# Patient Record
Sex: Male | Born: 1995
Health system: Southern US, Community
[De-identification: ages and names within clinical notes are randomized; demographics above are authoritative.]

## PROBLEM LIST (undated history)

## (undated) DIAGNOSIS — F419 Anxiety disorder, unspecified: Secondary | ICD-10-CM

## (undated) DIAGNOSIS — J45909 Unspecified asthma, uncomplicated: Secondary | ICD-10-CM

## (undated) HISTORY — DX: Unspecified asthma, uncomplicated: J45.909

## (undated) HISTORY — PX: TONSILLECTOMY: SUR1361

## (undated) HISTORY — DX: Anxiety disorder, unspecified: F41.9

## (undated) HISTORY — PX: WISDOM TOOTH EXTRACTION: SHX21

## (undated) HISTORY — PX: POLYPECTOMY: SHX149

---
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
## 2016-07-26 DIAGNOSIS — Z915 Personal history of self-harm: Secondary | ICD-10-CM | POA: Diagnosis not present

## 2016-07-26 DIAGNOSIS — R45851 Suicidal ideations: Secondary | ICD-10-CM | POA: Diagnosis not present

## 2016-07-26 DIAGNOSIS — F333 Major depressive disorder, recurrent, severe with psychotic symptoms: Secondary | ICD-10-CM | POA: Diagnosis not present

## 2016-07-26 DIAGNOSIS — F332 Major depressive disorder, recurrent severe without psychotic features: Secondary | ICD-10-CM | POA: Diagnosis not present

## 2016-07-26 DIAGNOSIS — G479 Sleep disorder, unspecified: Secondary | ICD-10-CM | POA: Diagnosis not present

## 2016-07-27 DIAGNOSIS — F332 Major depressive disorder, recurrent severe without psychotic features: Secondary | ICD-10-CM | POA: Diagnosis not present

## 2016-07-28 DIAGNOSIS — F332 Major depressive disorder, recurrent severe without psychotic features: Secondary | ICD-10-CM | POA: Diagnosis not present

## 2016-07-29 DIAGNOSIS — F332 Major depressive disorder, recurrent severe without psychotic features: Secondary | ICD-10-CM | POA: Diagnosis not present

## 2016-07-30 DIAGNOSIS — F332 Major depressive disorder, recurrent severe without psychotic features: Secondary | ICD-10-CM | POA: Diagnosis not present

## 2016-07-31 DIAGNOSIS — F332 Major depressive disorder, recurrent severe without psychotic features: Secondary | ICD-10-CM | POA: Diagnosis not present

## 2016-08-01 DIAGNOSIS — F332 Major depressive disorder, recurrent severe without psychotic features: Secondary | ICD-10-CM | POA: Diagnosis not present

## 2016-08-02 DIAGNOSIS — F332 Major depressive disorder, recurrent severe without psychotic features: Secondary | ICD-10-CM | POA: Diagnosis not present

## 2016-08-03 DIAGNOSIS — F332 Major depressive disorder, recurrent severe without psychotic features: Secondary | ICD-10-CM | POA: Diagnosis not present

## 2016-08-04 DIAGNOSIS — F332 Major depressive disorder, recurrent severe without psychotic features: Secondary | ICD-10-CM | POA: Diagnosis not present

## 2016-08-05 DIAGNOSIS — F331 Major depressive disorder, recurrent, moderate: Secondary | ICD-10-CM | POA: Diagnosis not present

## 2016-08-05 DIAGNOSIS — Z62898 Other specified problems related to upbringing: Secondary | ICD-10-CM | POA: Diagnosis not present

## 2016-08-05 DIAGNOSIS — F332 Major depressive disorder, recurrent severe without psychotic features: Secondary | ICD-10-CM | POA: Diagnosis not present

## 2016-08-05 DIAGNOSIS — Z635 Disruption of family by separation and divorce: Secondary | ICD-10-CM | POA: Diagnosis not present

## 2016-08-13 DIAGNOSIS — F332 Major depressive disorder, recurrent severe without psychotic features: Secondary | ICD-10-CM | POA: Diagnosis not present

## 2016-08-13 DIAGNOSIS — F331 Major depressive disorder, recurrent, moderate: Secondary | ICD-10-CM | POA: Diagnosis not present

## 2016-08-13 DIAGNOSIS — Z635 Disruption of family by separation and divorce: Secondary | ICD-10-CM | POA: Diagnosis not present

## 2016-08-13 DIAGNOSIS — Z62898 Other specified problems related to upbringing: Secondary | ICD-10-CM | POA: Diagnosis not present

## 2016-08-17 DIAGNOSIS — F332 Major depressive disorder, recurrent severe without psychotic features: Secondary | ICD-10-CM | POA: Diagnosis not present

## 2016-08-17 DIAGNOSIS — F331 Major depressive disorder, recurrent, moderate: Secondary | ICD-10-CM | POA: Diagnosis not present

## 2016-08-17 DIAGNOSIS — Z635 Disruption of family by separation and divorce: Secondary | ICD-10-CM | POA: Diagnosis not present

## 2016-08-17 DIAGNOSIS — Z62898 Other specified problems related to upbringing: Secondary | ICD-10-CM | POA: Diagnosis not present

## 2016-08-19 DIAGNOSIS — F331 Major depressive disorder, recurrent, moderate: Secondary | ICD-10-CM | POA: Diagnosis not present

## 2016-08-20 DIAGNOSIS — Z62898 Other specified problems related to upbringing: Secondary | ICD-10-CM | POA: Diagnosis not present

## 2016-08-20 DIAGNOSIS — F331 Major depressive disorder, recurrent, moderate: Secondary | ICD-10-CM | POA: Diagnosis not present

## 2016-08-20 DIAGNOSIS — Z635 Disruption of family by separation and divorce: Secondary | ICD-10-CM | POA: Diagnosis not present

## 2016-08-20 DIAGNOSIS — F332 Major depressive disorder, recurrent severe without psychotic features: Secondary | ICD-10-CM | POA: Diagnosis not present

## 2016-08-24 DIAGNOSIS — F331 Major depressive disorder, recurrent, moderate: Secondary | ICD-10-CM | POA: Diagnosis not present

## 2016-08-24 DIAGNOSIS — Z62898 Other specified problems related to upbringing: Secondary | ICD-10-CM | POA: Diagnosis not present

## 2016-08-24 DIAGNOSIS — F332 Major depressive disorder, recurrent severe without psychotic features: Secondary | ICD-10-CM | POA: Diagnosis not present

## 2016-08-24 DIAGNOSIS — Z635 Disruption of family by separation and divorce: Secondary | ICD-10-CM | POA: Diagnosis not present

## 2016-08-27 DIAGNOSIS — F331 Major depressive disorder, recurrent, moderate: Secondary | ICD-10-CM | POA: Diagnosis not present

## 2016-08-27 DIAGNOSIS — Z62898 Other specified problems related to upbringing: Secondary | ICD-10-CM | POA: Diagnosis not present

## 2016-08-27 DIAGNOSIS — Z635 Disruption of family by separation and divorce: Secondary | ICD-10-CM | POA: Diagnosis not present

## 2016-08-27 DIAGNOSIS — F332 Major depressive disorder, recurrent severe without psychotic features: Secondary | ICD-10-CM | POA: Diagnosis not present

## 2016-08-31 DIAGNOSIS — Z62898 Other specified problems related to upbringing: Secondary | ICD-10-CM | POA: Diagnosis not present

## 2016-08-31 DIAGNOSIS — Z635 Disruption of family by separation and divorce: Secondary | ICD-10-CM | POA: Diagnosis not present

## 2016-08-31 DIAGNOSIS — F332 Major depressive disorder, recurrent severe without psychotic features: Secondary | ICD-10-CM | POA: Diagnosis not present

## 2016-08-31 DIAGNOSIS — F331 Major depressive disorder, recurrent, moderate: Secondary | ICD-10-CM | POA: Diagnosis not present

## 2016-09-03 DIAGNOSIS — Z635 Disruption of family by separation and divorce: Secondary | ICD-10-CM | POA: Diagnosis not present

## 2016-09-03 DIAGNOSIS — Z62898 Other specified problems related to upbringing: Secondary | ICD-10-CM | POA: Diagnosis not present

## 2016-09-03 DIAGNOSIS — F331 Major depressive disorder, recurrent, moderate: Secondary | ICD-10-CM | POA: Diagnosis not present

## 2016-09-03 DIAGNOSIS — F332 Major depressive disorder, recurrent severe without psychotic features: Secondary | ICD-10-CM | POA: Diagnosis not present

## 2016-09-10 DIAGNOSIS — F332 Major depressive disorder, recurrent severe without psychotic features: Secondary | ICD-10-CM | POA: Diagnosis not present

## 2016-09-14 DIAGNOSIS — F332 Major depressive disorder, recurrent severe without psychotic features: Secondary | ICD-10-CM | POA: Diagnosis not present

## 2016-09-22 DIAGNOSIS — F332 Major depressive disorder, recurrent severe without psychotic features: Secondary | ICD-10-CM | POA: Diagnosis not present

## 2016-09-28 DIAGNOSIS — F332 Major depressive disorder, recurrent severe without psychotic features: Secondary | ICD-10-CM | POA: Diagnosis not present

## 2016-10-01 DIAGNOSIS — F332 Major depressive disorder, recurrent severe without psychotic features: Secondary | ICD-10-CM | POA: Diagnosis not present

## 2016-10-04 DIAGNOSIS — F332 Major depressive disorder, recurrent severe without psychotic features: Secondary | ICD-10-CM | POA: Diagnosis not present

## 2016-10-05 DIAGNOSIS — F332 Major depressive disorder, recurrent severe without psychotic features: Secondary | ICD-10-CM | POA: Diagnosis not present

## 2016-10-29 DIAGNOSIS — F332 Major depressive disorder, recurrent severe without psychotic features: Secondary | ICD-10-CM | POA: Diagnosis not present

## 2016-11-01 DIAGNOSIS — F332 Major depressive disorder, recurrent severe without psychotic features: Secondary | ICD-10-CM | POA: Diagnosis not present

## 2016-11-02 DIAGNOSIS — F332 Major depressive disorder, recurrent severe without psychotic features: Secondary | ICD-10-CM | POA: Diagnosis not present

## 2016-11-05 DIAGNOSIS — F332 Major depressive disorder, recurrent severe without psychotic features: Secondary | ICD-10-CM | POA: Diagnosis not present

## 2016-11-09 DIAGNOSIS — F332 Major depressive disorder, recurrent severe without psychotic features: Secondary | ICD-10-CM | POA: Diagnosis not present

## 2016-11-30 DIAGNOSIS — R05 Cough: Secondary | ICD-10-CM | POA: Diagnosis not present

## 2016-11-30 DIAGNOSIS — J45909 Unspecified asthma, uncomplicated: Secondary | ICD-10-CM | POA: Diagnosis not present

## 2016-12-01 ENCOUNTER — Other Ambulatory Visit: Payer: Self-pay | Admitting: Family Medicine

## 2016-12-01 ENCOUNTER — Encounter: Payer: Self-pay | Admitting: Family Medicine

## 2016-12-01 ENCOUNTER — Ambulatory Visit (INDEPENDENT_AMBULATORY_CARE_PROVIDER_SITE_OTHER): Payer: BLUE CROSS/BLUE SHIELD | Admitting: Family Medicine

## 2016-12-01 DIAGNOSIS — F3341 Major depressive disorder, recurrent, in partial remission: Secondary | ICD-10-CM

## 2016-12-01 DIAGNOSIS — L2381 Allergic contact dermatitis due to animal (cat) (dog) dander: Secondary | ICD-10-CM | POA: Diagnosis not present

## 2016-12-01 DIAGNOSIS — F324 Major depressive disorder, single episode, in partial remission: Secondary | ICD-10-CM | POA: Insufficient documentation

## 2016-12-01 DIAGNOSIS — F419 Anxiety disorder, unspecified: Secondary | ICD-10-CM | POA: Insufficient documentation

## 2016-12-01 DIAGNOSIS — J3089 Other allergic rhinitis: Secondary | ICD-10-CM

## 2016-12-01 DIAGNOSIS — J3081 Allergic rhinitis due to animal (cat) (dog) hair and dander: Secondary | ICD-10-CM | POA: Diagnosis not present

## 2016-12-01 DIAGNOSIS — J4520 Mild intermittent asthma, uncomplicated: Secondary | ICD-10-CM

## 2016-12-01 DIAGNOSIS — Z114 Encounter for screening for human immunodeficiency virus [HIV]: Secondary | ICD-10-CM

## 2016-12-01 DIAGNOSIS — Z0000 Encounter for general adult medical examination without abnormal findings: Secondary | ICD-10-CM

## 2016-12-01 DIAGNOSIS — Z13220 Encounter for screening for lipoid disorders: Secondary | ICD-10-CM

## 2016-12-01 DIAGNOSIS — J45909 Unspecified asthma, uncomplicated: Secondary | ICD-10-CM | POA: Insufficient documentation

## 2016-12-01 MED ORDER — CETIRIZINE HCL 10 MG PO TABS: 10.0000 mg | ORAL_TABLET | Freq: Every day | ORAL | 11 refills | Status: AC

## 2016-12-01 NOTE — Assessment & Plan Note (Signed)
Stable without exacerbation Recent had acute asthma flare vs allergic reaction by history seen by Urgent Care, resumed albuterol, suspected trigger cat - No recent night-time symptoms, hospitalization or steroid use  Plan: 1. Counseling on Asthma maintenance, trigger avoidance 2. Continue Albuterol 2 puffs q 4-6 hour PRN wheezing/cough/SOB - secondary to flare with allergy triggers / exercise induced. No new rx today, has existing and not due yet. Will refill when ready. 3. Considered add future Singulair given asthma/allergy/exercise induced, seems would be appropriate candidate for maintenance on this, instead of starting ICS - may not need if very limited or rare exacerbations, depends on cat allergy situation at home 4. Return criteria given to follow-up vs when to go to ED

## 2016-12-01 NOTE — Assessment & Plan Note (Signed)
Secondary mostly to cat allergy, now less environmental Continue Zyrtec Consider Singulair in future also with asthma

## 2016-12-01 NOTE — Progress Notes (Signed)
Subjective:    Patient ID: Joshua Kinds., male    DOB: [birth year], 21 y.o.   MRN: [id number]  Joshua Edwards. is a 21 y.o. male presenting on 12/01/2016 for Establish Care (has Hx of asthma needs inhalers allergic to cats)  He is new patient here to establish. No previous PCP, only followed by Psychiatry in past. He moved from [place] (from living with his father and stepmom) now to [place] to now live with Mother and Stepfather now.  HPI  Specialist: Psychiatrist - Dr Christella Noa [facility]) also had Psychology Therapist [facility]) Allergist [facility], unsure office/name)  Allergies (Environmental, Cat Dander) / Asthma (Mild Intermittent) - Chronic problem with asthma and allergies since childhood. He previously took allergy shots for 3 years in past with very good results to most environmental allergies, he states never finished 5 year series of shots.  - Regarding asthma, in childhood, previous triggers were allergies and exercise, had been on inhaler with PRN relief in past, never significant maintenance medication for asthma, no significant hospitalizations, now has not had any attack in months to years, until recently due to cat exposure. - Now improved but reports recent acute asthma / allergy attack secondary to exposure of mother's cat at home here in [place], describes soon onset wheezing, coughing, sore throat, itching and rash, he went to Urgent Care and received albuterol inhaler with relief - He is allergic to cats and Joshua Edwards - He continues to take OTC Zyrtec 10mg  daily, currently has Albuterol inhaler not due for refill yet, rarely uses only if flare otherwise not using during day or night, not even needed during exercise anymore  Depression / Anxiety - Reviewed chronic history of Depression / Anxiety today. He reports suspects had undiagnosed depression for years, did not disclose exact nature of his depression to me today, but has  family history and significant family life stressors with divorced parents, was living with father in [place] until now recent move to [place] to live with his mother and stepfather, which as significantly "reduced his stress" and got him "away from stressors" - Currently Sertaline 50mg  daily (taking half tab of 100mg ), Gabapentin 300mg  TID (for anxiety), and has Trazodone 100mg  tabs at night PRN (rarely takes anymore). He has been on medications for past 3 months, prior to that never on therapy. These were prescribed by psychiatrist in [place], has not set up with psychiatry or therapist here locally and is not interested at this time, requesting that PCP prescribe these medicines - Also admits prior had problems with insomnia and difficulty sleeping in past, partially due to anxiety and stress also with physical exertion, has since improved now with current situation, limited problem sleeping, not taking Trazodone - He is not due for refills now - Currently, he plans to start work locally, has interview for full time job - He denies any concerns for his safety - Also admits background history of prior suicidal ideation in past with prior attempts and behavioral health evaluation, he currently denies any suicidal or homicidal ideation or plan or recent attempt, and knows appropriate steps for safety if needed  Depression screen [facility] 2/9 12/01/2016  Decreased Interest 1  Down, Depressed, Hopeless 0  PHQ - 2 Score 1  Altered sleeping 0  Tired, decreased energy 0  Change in appetite 2  Feeling bad or failure about yourself  1  Trouble concentrating 0  Moving slowly or fidgety/restless 0  Suicidal thoughts 0  PHQ-9  Score 4   GAD 7 : Generalized Anxiety Score 12/01/2016  Nervous, Anxious, on Edge 0  Control/stop worrying 1  Worry too much - different things 1  Trouble relaxing 2  Restless 2  Easily annoyed or irritable 1  Afraid - awful might happen 0  Total GAD 7 Score 7  Anxiety  Difficulty Not difficult at all    Past Medical History:  Diagnosis Date  . Anxiety   . Asthma   . Depression    Past Surgical History:  Procedure Laterality Date  . POLYPECTOMY    . TONSILLECTOMY    . WISDOM TOOTH EXTRACTION     Social History   Social History  . Marital status: Single    Spouse name: N/A  . Number of children: N/A  . Years of education: N/A   Occupational History  . Pending interview    Social History Main Topics  . Smoking status: Never Smoker  . Smokeless tobacco: Never Used  . Alcohol use Yes  . Drug use: No  . Sexual activity: No   Other Topics Concern  . Not on file   Social History Narrative  . No narrative on file   Family History  Problem Relation Age of Onset  . Depression Mother   . Depression Father   . Mental illness Maternal Grandfather    No current outpatient prescriptions on file prior to visit.   No current facility-administered medications on file prior to visit.     Review of Systems  Constitutional: Negative for activity change, appetite change, chills, diaphoresis, fatigue, fever and unexpected weight change.  HENT: Negative for congestion, hearing loss and sinus pressure.   Eyes: Negative for visual disturbance.  Respiratory: Negative for apnea, cough, chest tightness, shortness of breath and wheezing (Resolved).   Cardiovascular: Negative for chest pain, palpitations and leg swelling.  Gastrointestinal: Negative for abdominal pain, anal bleeding, blood in stool, constipation, diarrhea, nausea and vomiting.  Endocrine: Negative for cold intolerance and polyuria.  Genitourinary: Negative for dysuria, frequency and hematuria.  Musculoskeletal: Negative for arthralgias and neck pain.  Skin: Negative for rash.  Allergic/Immunologic: Positive for environmental allergies.  Neurological: Negative for dizziness, weakness, light-headedness, numbness and headaches.  Hematological: Negative for adenopathy.    Psychiatric/Behavioral: Positive for dysphoric mood (Improved). Negative for agitation, behavioral problems, decreased concentration, self-injury, sleep disturbance and suicidal ideas. The patient is nervous/anxious (Improved).    Per HPI unless specifically indicated above     Objective:    BP 109/69   Pulse 64   Temp 98.5 F (36.9 C) (Oral)   Resp 16   Ht 5\' 6"  (1.676 m)   Wt 194 lb 9.6 oz (88.3 kg)   BMI 31.41 kg/m   Wt Readings from Last 3 Encounters:  12/01/16 194 lb 9.6 oz (88.3 kg)    Physical Exam  Constitutional: He is oriented to person, place, and time. He appears well-developed and well-nourished. No distress.  Well-appearing, comfortable, cooperative  HENT:  Head: Normocephalic and atraumatic.  Mouth/Throat: Oropharynx is clear and moist.  Eyes: Conjunctivae are normal. Right eye exhibits no discharge. Left eye exhibits no discharge.  Neck: Normal range of motion. Neck supple.  Cardiovascular: Normal rate, regular rhythm, normal heart sounds and intact distal pulses.   Pulmonary/Chest: Effort normal and breath sounds normal. No respiratory distress. He has no wheezes. He has no rales.  Good air movement.  Abdominal: Soft. Bowel sounds are normal.  Musculoskeletal: He exhibits no edema.  Neurological: He is  alert and oriented to person, place, and time.  Skin: Skin is warm and dry. No rash noted. He is not diaphoretic. No erythema.  Psychiatric: He has a normal mood and affect. His behavior is normal.  Well groomed, good eye contact, normal speech and thoughts. Does not appear anxious.  Nursing note and vitals reviewed.  No results found for this or any previous visit.    Assessment & Plan:   Problem List Items Addressed This Visit    Major depression in partial remission (HCC)    Suspected chronic mood disorder with mixed anxiety, currently improved on meds, prior years undiagnosed by history, complex with history of suicidal ideation and attempt in past.  Also prior insomnia, now improved. - Primary stressor/trigger: family stress with father in [place], now improved after move to [place] -GAD7: 7, not difficult / PHQ9: 4 (not diff) - No failed meds - Brief Psych / counseling few months ago in [place], now no longer established, had improved from therapist for months, now does not feel needed  Plan: 1. Discussion on chronic nature diagnosis depression / anxiety, management, complications, likely contributing to insomnia 2. Continue Sertraline 50mg  daily (Half of 100mg  tabs), currently has >1 month worth with refills, enough for up to 3 month supply, does not need new refill. Stable after 3 months on current dose, not needed titrate up to 100mg . Aware of potential side effects 3. Continue on Gabapentin 300mg  TID for anxiety, although discussed uncertain significant benefits for this based on indication 4. Advised recommend therapy / counseling in future, handout given when ready can consider local options, advised good to have if needed, may not need more intensive therapy again like before 5. Agree to manage meds now and not refer to Psychiatry, however if return of suicidal ideation or not improving on multiple med trials will refer, agrees. Suicide prevention contact #'s given on AVS handout, patient already has suicide hotline saved in phone 6. Follow-up 3 months, mood/anxiety, med adjust, GAD7/PHQ9 - goal to write new rx Sertraline 50mg  tabs, instead of 100mg , and can maybe taper off Gabapentin, possible DC trazodone, and uncertain duration of SSRI likely >6-12 months      Relevant Medications   traZODone (DESYREL) 100 MG tablet   sertraline (ZOLOFT) 100 MG tablet   Environmental and seasonal allergies    Secondary mostly to cat allergy, now less environmental Continue Zyrtec Consider Singulair in future also with asthma      Relevant Medications   cetirizine (ZYRTEC) 10 MG tablet   Cat allergy due to both airborne and skin  contact    Significant allergy with respiratory response and rash/hives, seems to be systemic, without evidence of anaphylaxis. - Reassurance with recent flare resolved with Albuterol inhaler - Continue PRN if flare - Continue Zyrtec 10mg  daily - Considered Singulair - Offered referral to Allergist consider finish allergy shots - Follow-up PRN, return criteria reviewed. May consider Epi Pen rx if needed in future      Asthma    Stable without exacerbation Recent had acute asthma flare vs allergic reaction by history seen by Urgent Care, resumed albuterol, suspected trigger cat - No recent night-time symptoms, hospitalization or steroid use  Plan: 1. Counseling on Asthma maintenance, trigger avoidance 2. Continue Albuterol 2 puffs q 4-6 hour PRN wheezing/cough/SOB - secondary to flare with allergy triggers / exercise induced. No new rx today, has existing and not due yet. Will refill when ready. 3. Considered add future Singulair given asthma/allergy/exercise induced, seems would  be appropriate candidate for maintenance on this, instead of starting ICS - may not need if very limited or rare exacerbations, depends on cat allergy situation at home 4. Return criteria given to follow-up vs when to go to ED      Relevant Medications   albuterol (PROVENTIL HFA;VENTOLIN HFA) 108 (90 Base) MCG/ACT inhaler   Anxiety    See A&P for depression Secondary to depression, seems closely related      Relevant Medications   traZODone (DESYREL) 100 MG tablet   sertraline (ZOLOFT) 100 MG tablet      Meds ordered this encounter  Medications  . gabapentin (NEURONTIN) 300 MG capsule    Sig: Take 300 mg by mouth 3 (three) times daily.  . traZODone (DESYREL) 100 MG tablet    Sig: Take 100 mg by mouth at bedtime.  . sertraline (ZOLOFT) 100 MG tablet    Sig: Take 100 mg by mouth daily.  Marland Kitchen albuterol (PROVENTIL HFA;VENTOLIN HFA) 108 (90 Base) MCG/ACT inhaler    Sig: Inhale 2 puffs into the lungs every 4  (four) hours as needed for wheezing or shortness of breath (cough).    Dispense:  1 Inhaler    Refill:  0  . cetirizine (ZYRTEC) 10 MG tablet    Sig: Take 1 tablet (10 mg total) by mouth daily.    Dispense:  30 tablet    Refill:  11    Follow up plan: Return in about 3 months (around 03/03/2017) for Annual Physical.  Greater than 45 minutes was spent in face-to-face time with this patient, more than half of which was devoted to counseling on diagnoses, management options, and long-term prognosis and complications of mental health conditions with Major Depression, Anxiety.  Saralyn Pilar, DO [facility] 12/01/2016, 6:49 PM

## 2016-12-01 NOTE — Assessment & Plan Note (Signed)
Significant allergy with respiratory response and rash/hives, seems to be systemic, without evidence of anaphylaxis. - Reassurance with recent flare resolved with Albuterol inhaler - Continue PRN if flare - Continue Zyrtec 10mg  daily - Considered Singulair - Offered referral to Allergist consider finish allergy shots - Follow-up PRN, return criteria reviewed. May consider Epi Pen rx if needed in future

## 2016-12-01 NOTE — Assessment & Plan Note (Signed)
See A&P for depression Secondary to depression, seems closely related

## 2016-12-01 NOTE — Assessment & Plan Note (Addendum)
Suspected chronic mood disorder with mixed anxiety, currently improved on meds, prior years undiagnosed by history, complex with history of suicidal ideation and attempt in past. Also prior insomnia, now improved. - Primary stressor/trigger: family stress with father in [place], now improved after move to [place] -GAD7: 7, not difficult / PHQ9: 4 (not diff) - No failed meds - Brief Psych / counseling few months ago in [place], now no longer established, had improved from therapist for months, now does not feel needed  Plan: 1. Discussion on chronic nature diagnosis depression / anxiety, management, complications, likely contributing to insomnia 2. Continue Sertraline 50mg  daily (Half of 100mg  tabs), currently has >1 month worth with refills, enough for up to 3 month supply, does not need new refill. Stable after 3 months on current dose, not needed titrate up to 100mg . Aware of potential side effects 3. Continue on Gabapentin 300mg  TID for anxiety, although discussed uncertain significant benefits for this based on indication 4. Advised recommend therapy / counseling in future, handout given when ready can consider local options, advised good to have if needed, may not need more intensive therapy again like before 5. Agree to manage meds now and not refer to Psychiatry, however if return of suicidal ideation or not improving on multiple med trials will refer, agrees. Suicide prevention contact #'s given on AVS handout, patient already has suicide hotline saved in phone 6. Follow-up 3 months, mood/anxiety, med adjust, GAD7/PHQ9 - goal to write new rx Sertraline 50mg  tabs, instead of 100mg , and can maybe taper off Gabapentin, possible DC trazodone, and uncertain duration of SSRI likely >6-12 months

## 2016-12-01 NOTE — Patient Instructions (Addendum)
Thank you for coming to the clinic today.  1. Let me know when ready for refill on Albuterol inhaler we can continue this  Continue current Zoloft (Sertraline) prescription for now, agree with cutting tablets in half for 50mg  daily, if after 1-2 more months do not need higher dose, then we will prescribe 50mg  tablets to continue  Consider Singulair (Montelukast generic) 10mg  nightly for allergy / exercise induced asthma - more stabilizing medication  Psych Counseling ONLY  Self Referral:  1. [facility]   Address: [address], [place], [place] [address] Hours: Open today  9AM-7PM Phone: [phone]  2. Anell Barr[facility]yl [address], [facility]  - [facility] Address: [address], [place], [place] [address] Phone: [phone]   [facility]SEL[facility]  Self Referral [facility]al [facility]) [place] [address], [place], [place] [address] Phone: [phone]  [facility], available walk-in 9am-4pm M-F [address] [place], [place] [address] Hours: 9am - 4pm (M-F, walk in available) [phone]  ----------------------------------------------  [facility] (All [facility]) [address], [address] [place], [id number] Phone: [phone] (Option 1) [email or web address]  Dr Stevphen RochesterHansen Su  ----------------------------------------------------------  If thoughts of harming yourself or others, or any significant concern about your safety, please call for help immediately:  - [facility] 24 Hour Help Line [phone] or [phone] - Suicide prevention hotline [phone]  - 911 ------------------------------------------------------------------------  Please schedule a Follow-up Appointment to: Return in about 3 months (around 03/03/2017) for Annual Physical.  If you have any other questions or concerns, please feel free to call the clinic or send a message through  MyChart. You may also schedule an earlier appointment if necessary.  Additionally, you may be receiving a survey about your experience at our clinic within a few days to 1 week by e-mail or mail. We value your feedback.  Saralyn PilarAlexander Yatzary Merriweather, DO [facility]

## 2016-12-03 ENCOUNTER — Ambulatory Visit: Payer: Self-pay | Admitting: Family Medicine

## 2017-03-03 ENCOUNTER — Encounter: Payer: Self-pay | Admitting: Family Medicine

## 2017-03-03 ENCOUNTER — Other Ambulatory Visit: Payer: Self-pay | Admitting: Family Medicine

## 2017-03-03 ENCOUNTER — Ambulatory Visit (INDEPENDENT_AMBULATORY_CARE_PROVIDER_SITE_OTHER): Payer: BLUE CROSS/BLUE SHIELD | Admitting: Family Medicine

## 2017-03-03 VITALS — BP 114/67 | HR 62 | Temp 98.1°F | Resp 16 | Ht 66.0 in | Wt 189.0 lb

## 2017-03-03 DIAGNOSIS — Z23 Encounter for immunization: Secondary | ICD-10-CM

## 2017-03-03 DIAGNOSIS — J3089 Other allergic rhinitis: Secondary | ICD-10-CM

## 2017-03-03 DIAGNOSIS — Z0000 Encounter for general adult medical examination without abnormal findings: Secondary | ICD-10-CM | POA: Diagnosis not present

## 2017-03-03 DIAGNOSIS — J3081 Allergic rhinitis due to animal (cat) (dog) hair and dander: Secondary | ICD-10-CM | POA: Diagnosis not present

## 2017-03-03 DIAGNOSIS — J4520 Mild intermittent asthma, uncomplicated: Secondary | ICD-10-CM

## 2017-03-03 DIAGNOSIS — Z13220 Encounter for screening for lipoid disorders: Secondary | ICD-10-CM | POA: Diagnosis not present

## 2017-03-03 DIAGNOSIS — L2381 Allergic contact dermatitis due to animal (cat) (dog) dander: Secondary | ICD-10-CM

## 2017-03-03 DIAGNOSIS — Z114 Encounter for screening for human immunodeficiency virus [HIV]: Secondary | ICD-10-CM

## 2017-03-03 DIAGNOSIS — Z131 Encounter for screening for diabetes mellitus: Secondary | ICD-10-CM

## 2017-03-03 DIAGNOSIS — F3341 Major depressive disorder, recurrent, in partial remission: Secondary | ICD-10-CM

## 2017-03-03 DIAGNOSIS — F419 Anxiety disorder, unspecified: Secondary | ICD-10-CM

## 2017-03-03 MED ORDER — ALBUTEROL SULFATE HFA 108 (90 BASE) MCG/ACT IN AERS: 2.0000 | INHALATION_SPRAY | RESPIRATORY_TRACT | 2 refills | Status: AC | PRN

## 2017-03-03 MED ORDER — SERTRALINE HCL 50 MG PO TABS: 50.0000 mg | ORAL_TABLET | Freq: Every day | ORAL | 11 refills | Status: DC

## 2017-03-03 MED ORDER — MONTELUKAST SODIUM 10 MG PO TABS: 10.0000 mg | ORAL_TABLET | Freq: Every day | ORAL | 11 refills | Status: DC

## 2017-03-03 MED ORDER — GABAPENTIN 300 MG PO CAPS: 300.0000 mg | ORAL_CAPSULE | Freq: Three times a day (TID) | ORAL | 11 refills | Status: DC

## 2017-03-03 NOTE — Assessment & Plan Note (Signed)
Stable chronic mood disorder with mixed anxiety + insomnia, currently improved on meds - Previous complex history / years undiagnosed / h/o suicidal ideation/attempt in past - Primary stressor/trigger: family stress with father in [place], now improved after move to [place] - PHQ/GAD scores stable - No active Psychiatry/Counseling locally, previous in [place]  Plan: 1. Continue Sertraline  daily (new rx  tabs, instead of half  since not titrate up) 2. Continue/Refilled Gabapentin  TID for anxiety 3. Continue Trazodone 50-100mg  nightly PRN 4. Future consider therapy/counseling if needed 5. Follow-up 6 months, mood/anxiety, med adjust, GAD7/PHQ9 - still plan to review future goals to taper off Gabapentin, possible DC trazodone if able

## 2017-03-03 NOTE — Assessment & Plan Note (Signed)
Improved now with less contact, also moving soon Continue Cetirizine, improved Start Singulair  nightly for asthma/allergy control/maintenance

## 2017-03-03 NOTE — Assessment & Plan Note (Signed)
Stable without exacerbation. Improved on cetirizine, as allergy is main trigger Seems overuse albuterol nightly, misusing as preventative inhaler - No recent night-time symptoms, hospitalization or steroid use  Plan: 1. Counseling on Asthma maintenance again and appropriate albuterol use, trigger avoidance 2. New rx Singulair  nightly for maintenance 3. STOP regular use Albuterol - SWITCH to PRN ONLY, refilled 4. Continue Cetirizine  daily 5. Follow-up 6 months re-eval asthma - may consider future ICS inhaler maintenance if need

## 2017-03-03 NOTE — Patient Instructions (Addendum)
Thank you for coming to the clinic today.  1. Keep up the good work!  Good luck with your upcoming move.  For Asthma / Allergies - STOP using Albuterol inhaler nightly, I recommend only using this as needed if you have a flare up with coughing, wheezing, shortness of breath or allergy flare.  - START new medicine Montelukast (Singulair)  nightly - this is a more stabilizing prevention medication for both allergies and asthma  Refilled Albuterol, refilled Gabapentin  Re-ordered Sertraline at lower dose now  tablets once daily instead of the  tabs that you were cutting in half   DUE for FASTING BLOOD WORK (no food or drink after midnight before the lab appointment, only water or coffee without cream/sugar on the morning of)  SCHEDULE "Lab Only" visit in the morning at the clinic for lab draw ANYTIME in next 1-2 weeks  - Make sure Lab Only appointment is at about 1 week before your next appointment, so that results will be available  For Lab Results, once available within 2-3 days of blood draw, you can can log in to MyChart online to view your results and a brief explanation. Also, we can discuss results at next follow-up visit.  Please schedule a Follow-up Appointment to: Return in about 6 months (around 09/01/2017) for Asthma/Allergy, Mood/Anxiety PHQ/GAD.  If you have any other questions or concerns, please feel free to call the clinic or send a message through MyChart. You may also schedule an earlier appointment if necessary.  Additionally, you may be receiving a survey about your experience at our clinic within a few days to 1 week by e-mail or mail. We value your feedback.  Saralyn Pilar, DO [facility], [place]

## 2017-03-03 NOTE — Assessment & Plan Note (Addendum)
Improved See A&P for depression Secondary to depression, seems closely related

## 2017-03-03 NOTE — Progress Notes (Signed)
Subjective:    Patient ID: Joshua Kinds., male    DOB: 12-16-1995, 21 y.o.   MRN: [id number]  Joshua Barich. is a 21 y.o. male presenting on 03/03/2017 for Annual Exam   HPI   Here for Annual Physical. He did not schedule fasting lab only prior to visit, will return for labs after.  Allergies (Environmental, Cat Dander) / Asthma (Mild Intermittent) - Last visit with me 12/01/16, for initial visit establish care, same problem, treated with review of asthma management, start Cetirizine daily, continued/refilled Albuterol, see prior notes for background information. - Interval update with overall seems allergies improved on Cetirizine, avoiding contact mostly - Today patient reports however admits to using Albuterol almost nightly as a more "preventative" approach to asthma and allergy. He does not endorse respiratory symptoms that trigger him to use inhaler, no recent flare ups, no night-time awakenings - Interested in Singulair option  Anxiety/Depression - Last visit with me 12/01/16, for initial visit establish care, same problem, treated with refilled previous meds Sertraline, Trazodone, Gabapentin, see prior notes for background information. - Interval update with stable to improved mood/anxiety - Today patient reports still doing well on current regimen, he admits improved "drive" and motivation to do more activities. He will be moving into new house soon Nov 1, more independent. He plans to resume more regular gym exercise in future as well - He is taking Sertraline HALF tabs (of ) now dose  daily still, has not increased, would like new rx for  tabs - Still taking Trazodone 50-100mg  at night PRN - Still taking Gabapentin  TID  Health Maintenance: - Due for Flu Shot, will receive today - Due for routine HIV screen, will return for labs  Depression screen [facility] 2/9 03/03/2017 12/01/2016  Decreased Interest 1 1  Down, Depressed, Hopeless 1 0  PHQ - 2  Score 2 1  Altered sleeping 1 0  Tired, decreased energy 0 0  Change in appetite 1 2  Feeling bad or failure about yourself  1 1  Trouble concentrating 0 0  Moving slowly or fidgety/restless 0 0  Suicidal thoughts 0 0  PHQ-9 Score 5 4  Difficult doing work/chores Somewhat difficult -   GAD 7 : Generalized Anxiety Score 03/03/2017 12/01/2016  Nervous, Anxious, on Edge 1 0  Control/stop worrying 2 1  Worry too much - different things 2 1  Trouble relaxing 0 2  Restless 0 2  Easily annoyed or irritable 2 1  Afraid - awful might happen 0 0  Total GAD 7 Score 7 7  Anxiety Difficulty Somewhat difficult Not difficult at all    Past Medical History:  Diagnosis Date  . Anxiety   . Asthma   . Depression    Past Surgical History:  Procedure Laterality Date  . POLYPECTOMY    . TONSILLECTOMY    . WISDOM TOOTH EXTRACTION     Social History   Social History  . Marital status: Single    Spouse name: N/A  . Number of children: N/A  . Years of education: N/A   Occupational History  . Pending interview    Social History Main Topics  . Smoking status: Never Smoker  . Smokeless tobacco: Never Used  . Alcohol use Yes  . Drug use: No  . Sexual activity: No   Other Topics Concern  . Not on file   Social History Narrative  . No narrative on file   Family History  Problem Relation  Age of Onset  . Depression Mother   . Depression Father   . Mental illness Maternal Grandfather    Current Outpatient Prescriptions on File Prior to Visit  Medication Sig  . cetirizine (ZYRTEC) 10 MG tablet Take 1 tablet (10 mg total) by mouth daily.  . traZODone (DESYREL) 100 MG tablet Take 100 mg by mouth at bedtime.   No current facility-administered medications on file prior to visit.     Review of Systems  Constitutional: Negative for activity change, appetite change, chills, diaphoresis, fatigue, fever and unexpected weight change.  HENT: Negative for congestion and hearing loss.     Eyes: Negative for visual disturbance.  Respiratory: Negative for apnea, cough, choking, chest tightness, shortness of breath and wheezing.   Cardiovascular: Negative for chest pain, palpitations and leg swelling.  Gastrointestinal: Negative for abdominal pain, anal bleeding, blood in stool, constipation, diarrhea, nausea and vomiting.  Endocrine: Negative for cold intolerance and polyuria.  Genitourinary: Negative for decreased urine volume, dysuria, frequency, hematuria and testicular pain.  Musculoskeletal: Negative for arthralgias, back pain and neck pain.  Skin: Negative for rash.  Allergic/Immunologic: Positive for environmental allergies.  Neurological: Negative for dizziness, weakness, light-headedness, numbness and headaches.  Hematological: Negative for adenopathy.  Psychiatric/Behavioral: Positive for dysphoric mood (improved). Negative for agitation, behavioral problems, self-injury, sleep disturbance and suicidal ideas. The patient is nervous/anxious (improved).    Per HPI unless specifically indicated above     Objective:    BP 114/67   Pulse 62   Temp 98.1 F (36.7 C) (Oral)   Resp 16   Ht  (1.676 m)   Wt 189 lb (85.7 kg)   BMI 30.51 kg/m   Wt Readings from Last 3 Encounters:  03/03/17 189 lb (85.7 kg)  12/01/16 194 lb 9.6 oz (88.3 kg)    Physical Exam  Constitutional: He is oriented to person, place, and time. He appears well-developed and well-nourished. No distress.  Well-appearing, comfortable, cooperative  HENT:  Head: Normocephalic and atraumatic.  Mouth/Throat: Oropharynx is clear and moist.  Frontal / maxillary sinuses non-tender. Nares patent without purulence or edema. Bilateral TMs clear without erythema, effusion or bulging. Oropharynx clear without erythema, exudates, edema or asymmetry.  Eyes: Pupils are equal, round, and reactive to light. Conjunctivae and EOM are normal. Right eye exhibits no discharge. Left eye exhibits no discharge.   Neck: Normal range of motion. Neck supple. No thyromegaly present.  Cardiovascular: Normal rate, regular rhythm, normal heart sounds and intact distal pulses.   No murmur heard. Pulmonary/Chest: Effort normal and breath sounds normal. No respiratory distress. He has no wheezes. He has no rales.  Abdominal: Soft. Bowel sounds are normal. He exhibits no distension and no mass. There is no tenderness.  Musculoskeletal: Normal range of motion. He exhibits no edema or tenderness.  Upper / Lower Extremities: - Normal muscle tone, strength bilateral upper extremities 5/5, lower extremities 5/5  Lymphadenopathy:    He has no cervical adenopathy.  Neurological: He is alert and oriented to person, place, and time.  Distal sensation intact to light touch all extremities  Skin: Skin is warm and dry. No rash noted. He is not diaphoretic. No erythema.  Psychiatric: He has a normal mood and affect. His behavior is normal.  Well groomed, good eye contact, normal speech and thoughts. Good insight into health.  Nursing note and vitals reviewed.  No results found for this or any previous visit.    Assessment & Plan:   Problem List Items  Addressed This Visit    Anxiety    Improved See A&P for depression Secondary to depression, seems closely related      Relevant Medications   sertraline (ZOLOFT) 50 MG tablet   gabapentin (NEURONTIN) 300 MG capsule   Asthma    Stable without exacerbation. Improved on cetirizine, as allergy is main trigger Seems overuse albuterol nightly, misusing as preventative inhaler - No recent night-time symptoms, hospitalization or steroid use  Plan: 1. Counseling on Asthma maintenance again and appropriate albuterol use, trigger avoidance 2. New rx Singulair  nightly for maintenance 3. STOP regular use Albuterol - SWITCH to PRN ONLY, refilled 4. Continue Cetirizine  daily 5. Follow-up 6 months re-eval asthma - may consider future ICS inhaler maintenance if  need      Relevant Medications   montelukast (SINGULAIR) 10 MG tablet   albuterol (PROVENTIL HFA;VENTOLIN HFA) 108 (90 Base) MCG/ACT inhaler   Cat allergy due to both airborne and skin contact    Improved now with less contact, also moving soon Continue Cetirizine, improved Start Singulair  nightly for asthma/allergy control/maintenance      Relevant Medications   montelukast (SINGULAIR) 10 MG tablet   Environmental and seasonal allergies    Stable Continue Cetirizine Add Singulair  nightly      Relevant Medications   montelukast (SINGULAIR) 10 MG tablet   Major depression in partial remission (HCC)    Stable chronic mood disorder with mixed anxiety + insomnia, currently improved on meds - Previous complex history / years undiagnosed / h/o suicidal ideation/attempt in past - Primary stressor/trigger: family stress with father in [place], now improved after move to [place] - PHQ/GAD scores stable - No active Psychiatry/Counseling locally, previous in [place]  Plan: 1. Continue Sertraline  daily (new rx  tabs, instead of half  since not titrate up) 2. Continue/Refilled Gabapentin  TID for anxiety 3. Continue Trazodone 50-100mg  nightly PRN 4. Future consider therapy/counseling if needed 5. Follow-up 6 months, mood/anxiety, med adjust, GAD7/PHQ9 - still plan to review future goals to taper off Gabapentin, possible DC trazodone if able      Relevant Medications   sertraline (ZOLOFT) 50 MG tablet   Other Relevant Orders   COMPLETE METABOLIC PANEL WITH GFR    Other Visit Diagnoses    Annual physical exam    -  Primary   Relevant Orders   COMPLETE METABOLIC PANEL WITH GFR   Lipid panel   CBC with Differential/Platelet   Hemoglobin A1c   Needs flu shot       Relevant Orders   Flu Vaccine QUAD 36+ mos IM (Completed)   Screening cholesterol level       Relevant Orders   Lipid panel   Screening for diabetes mellitus       Relevant Orders    Hemoglobin A1c   Screening for HIV (human immunodeficiency virus)       Relevant Orders   HIV antibody      Meds ordered this encounter  Medications  . montelukast (SINGULAIR) 10 MG tablet    Sig: Take 1 tablet (10 mg total) by mouth at bedtime.    Dispense:  30 tablet    Refill:  11  . sertraline (ZOLOFT) 50 MG tablet    Sig: Take 1 tablet (50 mg total) by mouth daily.    Dispense:  30 tablet    Refill:  11  . gabapentin (NEURONTIN) 300 MG capsule    Sig: Take 1 capsule (300 mg total) by mouth  3 (three) times daily.    Dispense:  90 capsule    Refill:  11  . albuterol (PROVENTIL HFA;VENTOLIN HFA) 108 (90 Base) MCG/ACT inhaler    Sig: Inhale 2 puffs into the lungs every 4 (four) hours as needed for wheezing or shortness of breath (cough).    Dispense:  1 Inhaler    Refill:  2    Follow up plan: Return in about 6 months (around 09/01/2017) for Asthma/Allergy, Mood/Anxiety PHQ/GAD.  Saralyn Pilar, DO [facility] 03/03/2017, 9:42 PM

## 2017-03-03 NOTE — Assessment & Plan Note (Signed)
Stable Continue Cetirizine Add Singulair  nightly

## 2017-03-04 ENCOUNTER — Encounter: Payer: BLUE CROSS/BLUE SHIELD | Admitting: Family Medicine

## 2017-03-11 ENCOUNTER — Other Ambulatory Visit: Payer: BLUE CROSS/BLUE SHIELD

## 2017-03-11 DIAGNOSIS — F3341 Major depressive disorder, recurrent, in partial remission: Secondary | ICD-10-CM | POA: Diagnosis not present

## 2017-03-11 DIAGNOSIS — Z0000 Encounter for general adult medical examination without abnormal findings: Secondary | ICD-10-CM | POA: Diagnosis not present

## 2017-03-11 DIAGNOSIS — Z13220 Encounter for screening for lipoid disorders: Secondary | ICD-10-CM | POA: Diagnosis not present

## 2017-03-11 DIAGNOSIS — Z114 Encounter for screening for human immunodeficiency virus [HIV]: Secondary | ICD-10-CM

## 2017-03-11 DIAGNOSIS — R748 Abnormal levels of other serum enzymes: Secondary | ICD-10-CM

## 2017-03-11 DIAGNOSIS — Z131 Encounter for screening for diabetes mellitus: Secondary | ICD-10-CM | POA: Diagnosis not present

## 2017-03-12 LAB — LIPID PANEL
CHOLESTEROL: 184 mg/dL (ref <200)
HDL: 47 mg/dL (ref >40)
LDL CHOLESTEROL (CALC): 110 mg/dL — AB
Non-HDL Cholesterol (Calc): 137 mg/dL (calc) — ABNORMAL HIGH (ref <130)
Total CHOL/HDL Ratio: 3.9 (calc) (ref <5.0)
Triglycerides: 156 mg/dL — ABNORMAL HIGH (ref <150)

## 2017-03-12 LAB — COMPLETE METABOLIC PANEL WITH GFR
AG RATIO: 2.7 (calc) — AB (ref 1.0–2.5)
ALBUMIN MSPROF: 4.1 g/dL (ref 3.6–5.1)
ALT: 17 U/L (ref 9–46)
AST: 15 U/L (ref 10–40)
Alkaline phosphatase (APISO): 30 U/L — ABNORMAL LOW (ref 40–115)
BILIRUBIN TOTAL: 0.6 mg/dL (ref 0.2–1.2)
BUN: 13 mg/dL (ref 7–25)
CHLORIDE: 105 mmol/L (ref 98–110)
CO2: 31 mmol/L (ref 20–32)
Calcium: 9.1 mg/dL (ref 8.6–10.3)
Creat: 0.9 mg/dL (ref 0.60–1.35)
GFR, EST AFRICAN AMERICAN: 140 mL/min/{1.73_m2} (ref >=60)
GFR, Est Non African American: 121 mL/min/{1.73_m2} (ref >=60)
GLOBULIN: 1.5 g/dL — AB (ref 1.9–3.7)
Glucose, Bld: 85 mg/dL (ref 65–139)
POTASSIUM: 4.3 mmol/L (ref 3.5–5.3)
SODIUM: 142 mmol/L (ref 135–146)
Total Protein: 5.6 g/dL — ABNORMAL LOW (ref 6.1–8.1)

## 2017-03-12 LAB — CBC WITH DIFFERENTIAL/PLATELET
BASOS PCT: 0.8 %
Basophils Absolute: 49 cells/uL (ref 0–200)
Eosinophils Absolute: 49 cells/uL (ref 15–500)
Eosinophils Relative: 0.8 %
HCT: 41.5 % (ref 38.5–50.0)
HEMOGLOBIN: 14.4 g/dL (ref 13.2–17.1)
Lymphs Abs: 1915 cells/uL (ref 850–3900)
MCH: 31.2 pg (ref 27.0–33.0)
MCHC: 34.7 g/dL (ref 32.0–36.0)
MCV: 90 fL (ref 80.0–100.0)
MPV: 10.8 fL (ref 7.5–12.5)
Monocytes Relative: 6.1 %
NEUTROS ABS: 3715 {cells}/uL (ref 1500–7800)
Neutrophils Relative %: 60.9 %
Platelets: 225 10*3/uL (ref 140–400)
RBC: 4.61 10*6/uL (ref 4.20–5.80)
RDW: 13.5 % (ref 11.0–15.0)
TOTAL LYMPHOCYTE: 31.4 %
WBC: 6.1 10*3/uL (ref 3.8–10.8)
WBCMIX: 372 {cells}/uL (ref 200–950)

## 2017-03-12 LAB — HEMOGLOBIN A1C
HEMOGLOBIN A1C: 4.6 %{Hb} (ref <5.7)
MEAN PLASMA GLUCOSE: 85 (calc)
eAG (mmol/L): 4.7 (calc)

## 2017-03-12 LAB — HIV ANTIBODY (ROUTINE TESTING W REFLEX): HIV 1&2 Ab, 4th Generation: NONREACTIVE

## 2017-03-15 DIAGNOSIS — R748 Abnormal levels of other serum enzymes: Secondary | ICD-10-CM | POA: Insufficient documentation

## 2017-09-15 ENCOUNTER — Ambulatory Visit: Payer: BLUE CROSS/BLUE SHIELD | Admitting: Family Medicine

## 2017-09-15 ENCOUNTER — Other Ambulatory Visit: Payer: Self-pay | Admitting: Family Medicine

## 2017-09-15 ENCOUNTER — Encounter: Payer: Self-pay | Admitting: Family Medicine

## 2017-09-15 VITALS — BP 118/68 | HR 82 | Temp 97.8°F | Resp 16 | Ht 66.0 in | Wt 196.0 lb

## 2017-09-15 DIAGNOSIS — G4726 Circadian rhythm sleep disorder, shift work type: Secondary | ICD-10-CM | POA: Diagnosis not present

## 2017-09-15 DIAGNOSIS — F419 Anxiety disorder, unspecified: Secondary | ICD-10-CM

## 2017-09-15 DIAGNOSIS — F3341 Major depressive disorder, recurrent, in partial remission: Secondary | ICD-10-CM | POA: Diagnosis not present

## 2017-09-15 DIAGNOSIS — J4520 Mild intermittent asthma, uncomplicated: Secondary | ICD-10-CM

## 2017-09-15 DIAGNOSIS — E7800 Pure hypercholesterolemia, unspecified: Secondary | ICD-10-CM

## 2017-09-15 DIAGNOSIS — Z131 Encounter for screening for diabetes mellitus: Secondary | ICD-10-CM

## 2017-09-15 DIAGNOSIS — J3089 Other allergic rhinitis: Secondary | ICD-10-CM

## 2017-09-15 DIAGNOSIS — Z0000 Encounter for general adult medical examination without abnormal findings: Secondary | ICD-10-CM

## 2017-09-15 MED ORDER — SERTRALINE HCL 100 MG PO TABS: 100.0000 mg | ORAL_TABLET | Freq: Every day | ORAL | 11 refills | Status: DC

## 2017-09-15 NOTE — Assessment & Plan Note (Signed)
Recent less effective SSRI at current dose Otherwise relatively stable chronic mood disorder with mixed anxiety + insomnia, currently improved on meds - Previous complex history / years undiagnosed / h/o suicidal ideation/attempt in past - Primary stressor/trigger: family stress - improved now living on his own - PHQ score increased, GAD score improved - No active Psychiatry/Counseling locally  Plan: 1. INCREASE Sertraline from 50 to 100mg  daily new rx sent 2. Continue Gabapentin 300mg  TID for anxiety 3. Continue Trazodone 50-100mg  nightly PRN - emphasis on may need to take regularly as add on therapy for better sleep may help better mood / energy / motivation 4. Future consider therapy/counseling if needed 5. Follow-up 6 months annual phys labs

## 2017-09-15 NOTE — Assessment & Plan Note (Signed)
Mild flare ups during spring allergy season Overall seems controlled on current regimen Trigger for asthma, but has not been a problem Continue Singulair 10mg  nightly, Cetirizine 10mg  daily, OTC Fluticasone nasal

## 2017-09-15 NOTE — Assessment & Plan Note (Signed)
Still improved, now less stressors living on his own out of parent house See A&P for depression, seems secondary to depression - Increase Sertraline from 50 to 100mg , continue other meds

## 2017-09-15 NOTE — Assessment & Plan Note (Signed)
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

## 2017-09-15 NOTE — Progress Notes (Signed)
Subjective:    Patient ID: Joshua Kinds., male    DOB: [birth year], 22 y.o.   MRN: [id number]  Joshua Weese. is a 22 y.o. male presenting on 09/15/2017 for Asthma and Depression   HPI   FOLLOW-UP Allergies (Environmental, Cat Dander) / Asthma (Mild Intermittent) - Last visit with me 02/2017, for same problem, treated with new start Singulair 10mg  in addition to anti-histamine, see prior notes for background information. - Interval update with seems improved on current regimen, but still has some allergy/asthma symptoms - Today patient reports biggest change for him was move to inherited farmhouse that he has improved, and no longer living at home w/ parents and cat with cat dander he was allergic to. He keeps house clean and avoids dust. - Has some minor allergy symptoms - Taking Cetirizine 10mg  daily, Singulair 10mg  nightly, generic Fluticasone 2 sprays each nare daily - Has mild asthma flare up and wheezing when near cat at parents home when he visits, seems to trigger promptly, taking Albuterol, now he is using it < 1 weekly, only few times month with triggers - No hospitalizations or ED visits, not required prednisone or other treatment for asthma flare - Denies dyspnea, wheezing, coughing, fever chills, night time awakening  FOLLOW-UP DEPRESSION, Anxiety - Last visit with me 02/2017, for same problem, treated with renewed rx Sertraline to continue 50mg  daily and other meds w/o change, considered future taper off meds if need, see prior notes for background information. - Interval update, he has reduced life stressors now living on his own and still working, overall doing well he has less anxiety or stressors, see score - Today patient reports he did better initially when first started SSRI with more motivation - Initially started SSRI sertraline 50mg  in March 2018, he was given rx 100mg  for eventually if need but he never took full dose, he had more motivation, he tried  to pursue this initially but then eventually he lost his motivation and felt like medicine was not helping as much anymore - He thinks other meds Gabapentin 300mg  TID, helps his anxiety and allow relax - Admits one stressor today he got a speeding ticket - For past 8 months, works 3rd shift, he does sleep during day, occasionally does not get enough sleep, has black out curtains - He takes Trazodone 100mg  nightly PRN ONLY not regularly, it does help - Denies suicidal or homicidal ideation, worsening mood or anxiety, see PHQ GAD   Depression screen [facility] 2/9 09/15/2017 03/03/2017 12/01/2016  Decreased Interest 3 1 1   Down, Depressed, Hopeless 0 1 0  PHQ - 2 Score 3 2 1   Altered sleeping 1 1 0  Tired, decreased energy 1 0 0  Change in appetite 2 1 2   Feeling bad or failure about yourself  1 1 1   Trouble concentrating 1 0 0  Moving slowly or fidgety/restless 1 0 0  Suicidal thoughts 0 0 0  PHQ-9 Score 10 5 4   Difficult doing work/chores Somewhat difficult Somewhat difficult -   GAD 7 : Generalized Anxiety Score 09/15/2017 03/03/2017 12/01/2016  Nervous, Anxious, on Edge 0 1 0  Control/stop worrying 0 2 1  Worry too much - different things 0 2 1  Trouble relaxing 1 0 2  Restless 0 0 2  Easily annoyed or irritable 1 2 1   Afraid - awful might happen 0 0 0  Total GAD 7 Score 2 7 7   Anxiety Difficulty Not difficult at all Somewhat  difficult Not difficult at all     Social History   Tobacco Use  . Smoking status: Never Smoker  . Smokeless tobacco: Never Used  Substance Use Topics  . Alcohol use: Yes  . Drug use: No    Review of Systems Per HPI unless specifically indicated above     Objective:    BP 118/68   Pulse 82   Temp 97.8 F (36.6 C) (Oral)   Resp 16   Ht 5\' 6"  (1.676 m)   Wt 196 lb (88.9 kg)   SpO2 100%   BMI 31.64 kg/m   Wt Readings from Last 3 Encounters:  09/15/17 196 lb (88.9 kg)  03/03/17 189 lb (85.7 kg)  12/01/16 194 lb 9.6 oz (88.3 kg)    Physical  Exam  Constitutional: He is oriented to person, place, and time. He appears well-developed and well-nourished. No distress.  Well-appearing, comfortable, cooperative  HENT:  Head: Normocephalic and atraumatic.  Mouth/Throat: Oropharynx is clear and moist.  Eyes: Conjunctivae are normal. Right eye exhibits no discharge. Left eye exhibits no discharge.  Neck: Normal range of motion.  Cardiovascular: Normal rate, regular rhythm, normal heart sounds and intact distal pulses.  No murmur heard. Pulmonary/Chest: Effort normal and breath sounds normal. No respiratory distress. He has no wheezes. He has no rales.  Musculoskeletal: Normal range of motion. He exhibits no edema.  Neurological: He is alert and oriented to person, place, and time.  Skin: Skin is warm and dry. No rash noted. He is not diaphoretic. No erythema.  Psychiatric: He has a normal mood and affect. His behavior is normal.  Well groomed, good eye contact, normal speech and thoughts. Good insight into health. Does not appear anxious.  Nursing note and vitals reviewed.  Results for orders placed or performed in visit on 03/11/17  COMPLETE METABOLIC PANEL WITH GFR  Result Value Ref Range   Glucose, Bld 85 65 - 139 mg/dL   BUN 13 7 - 25 mg/dL   Creat 1.61 0.96 - 0.45 mg/dL   GFR, Est Non African American 121 > OR = 60 mL/min/1.15m2   GFR, Est African American 140 > OR = 60 mL/min/1.64m2   BUN/Creatinine Ratio NOT APPLICABLE 6 - 22 (calc)   Sodium 142 135 - 146 mmol/L   Potassium 4.3 3.5 - 5.3 mmol/L   Chloride 105 98 - 110 mmol/L   CO2 31 20 - 32 mmol/L   Calcium 9.1 8.6 - 10.3 mg/dL   Total Protein 5.6 (L) 6.1 - 8.1 g/dL   Albumin 4.1 3.6 - 5.1 g/dL   Globulin 1.5 (L) 1.9 - 3.7 g/dL (calc)   AG Ratio 2.7 (H) 1.0 - 2.5 (calc)   Total Bilirubin 0.6 0.2 - 1.2 mg/dL   Alkaline phosphatase (APISO) 30 (L) 40 - 115 U/L   AST 15 10 - 40 U/L   ALT 17 9 - 46 U/L  Lipid panel  Result Value Ref Range   Cholesterol 184 <200 mg/dL     HDL 47 >40 mg/dL   Triglycerides 981 (H) <150 mg/dL   LDL Cholesterol (Calc) 110 (H) mg/dL (calc)   Total CHOL/HDL Ratio 3.9 <5.0 (calc)   Non-HDL Cholesterol (Calc) 137 (H) <130 mg/dL (calc)  CBC with Differential/Platelet  Result Value Ref Range   WBC 6.1 3.8 - 10.8 Thousand/uL   RBC 4.61 4.20 - 5.80 Million/uL   Hemoglobin 14.4 13.2 - 17.1 g/dL   HCT 19.1 47.8 - 29.5 %   MCV 90.0 80.0 -  100.0 fL   MCH 31.2 27.0 - 33.0 pg   MCHC 34.7 32.0 - 36.0 g/dL   RDW 16.1 09.6 - 04.5 %   Platelets 225 140 - 400 Thousand/uL   MPV 10.8 7.5 - 12.5 fL   Neutro Abs 3,715 1,500 - 7,800 cells/uL   Lymphs Abs 1,915 850 - 3,900 cells/uL   WBC mixed population 372 200 - 950 cells/uL   Eosinophils Absolute 49 15 - 500 cells/uL   Basophils Absolute 49 0 - 200 cells/uL   Neutrophils Relative % 60.9 %   Total Lymphocyte 31.4 %   Monocytes Relative 6.1 %   Eosinophils Relative 0.8 %   Basophils Relative 0.8 %  Hemoglobin A1c  Result Value Ref Range   Hgb A1c MFr Bld 4.6 <5.7 % of total Hgb   Mean Plasma Glucose 85 (calc)   eAG (mmol/L) 4.7 (calc)  HIV antibody  Result Value Ref Range   HIV 1&2 Ab, 4th Generation NON-REACTIVE NON-REACTI      Assessment & Plan:   Problem List Items Addressed This Visit    Anxiety    Still improved, now less stressors living on his own out of parent house See A&P for depression, seems secondary to depression - Increase Sertraline from 50 to 100mg , continue other meds      Relevant Medications   sertraline (ZOLOFT) 100 MG tablet   Asthma    Stable without exacerbation Improved control on current allergy/asthma regimen No longer overusing albuterol - No recent night-time symptoms, hospitalization or steroid use  Plan: 1. Continue Cetirizine, Flonase, Singulair 10mg  nightly for maintenance 2. Use Albuterol PRN ONLY 3. Follow-up 6 months re-eval asthma - may consider future ICS inhaler maintenance if need      Environmental and seasonal allergies     Mild flare ups during spring allergy season Overall seems controlled on current regimen Trigger for asthma, but has not been a problem Continue Singulair 10mg  nightly, Cetirizine 10mg  daily, OTC Fluticasone nasal      Major depression in partial remission (HCC) - Primary    Recent less effective SSRI at current dose Otherwise relatively stable chronic mood disorder with mixed anxiety + insomnia, currently improved on meds - Previous complex history / years undiagnosed / h/o suicidal ideation/attempt in past - Primary stressor/trigger: family stress - improved now living on his own - PHQ score increased, GAD score improved - No active Psychiatry/Counseling locally  Plan: 1. INCREASE Sertraline from 50 to 100mg  daily new rx sent 2. Continue Gabapentin 300mg  TID for anxiety 3. Continue Trazodone 50-100mg  nightly PRN - emphasis on may need to take regularly as add on therapy for better sleep may help better mood / energy / motivation 4. Future consider therapy/counseling if needed 5. 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 sertraline (ZOLOFT) 100 MG tablet    Sig: Take 1 tablet (100 mg total) by mouth daily.  Dispense:  30 tablet    Refill:  11    Dose increase    Follow up plan: Return in about 6 months (around 03/17/2018) for Annual Physical.  Future labs ordered for 02/2018.  Saralyn PilarAlexander Karamalegos, DO [facility] 09/15/2017, 9:08 AM

## 2017-09-15 NOTE — Assessment & Plan Note (Signed)
Stable without exacerbation Improved control on current allergy/asthma regimen No longer overusing albuterol - No recent night-time symptoms, hospitalization or steroid use  Plan: 1. Continue Cetirizine, Flonase, Singulair 10mg  nightly for maintenance 2. Use Albuterol PRN ONLY 3. Follow-up 6 months re-eval asthma - may consider future ICS inhaler maintenance if need

## 2017-09-15 NOTE — Patient Instructions (Addendum)
Thank you for coming to the office today.  Increased Sertraline from 50 to 100mg  - new rx sent, it may take few weeks to get to full effect. In future we may consider adjusting dose further, up to 150 is possible as well. If needed, or we can try switching or adding other med to adjust this better if it is needed  Continue Trazodone as needed for sleep, in future if you feel like you want to try a more regular dosing schedule you can try this every night to help with the 3rd shift sleep issue  Continue allergy/asthma medications  DUE for FASTING BLOOD WORK (no food or drink after midnight before the lab appointment, only water or coffee without cream/sugar on the morning of)  SCHEDULE "Lab Only" visit in the morning at the clinic for lab draw in 6 MONTHS   - Make sure Lab Only appointment is at about 1 week before your next appointment, so that results will be available  For Lab Results, once available within 2-3 days of blood draw, you can can log in to MyChart online to view your results and a brief explanation. Also, we can discuss results at next follow-up visit.  Please schedule a Follow-up Appointment to: Return in about 6 months (around 03/17/2018) for Annual Physical.  If you have any other questions or concerns, please feel free to call the office or send a message through MyChart. You may also schedule an earlier appointment if necessary.  Additionally, you may be receiving a survey about your experience at our office within a few days to 1 week by e-mail or mail. We value your feedback.  Saralyn PilarAlexander Livio Ledwith, DO [facility], [place]

## 2018-03-10 ENCOUNTER — Other Ambulatory Visit: Payer: BLUE CROSS/BLUE SHIELD

## 2018-03-10 DIAGNOSIS — G4726 Circadian rhythm sleep disorder, shift work type: Secondary | ICD-10-CM

## 2018-03-10 DIAGNOSIS — Z131 Encounter for screening for diabetes mellitus: Secondary | ICD-10-CM

## 2018-03-10 DIAGNOSIS — E7800 Pure hypercholesterolemia, unspecified: Secondary | ICD-10-CM | POA: Diagnosis not present

## 2018-03-10 DIAGNOSIS — Z0000 Encounter for general adult medical examination without abnormal findings: Secondary | ICD-10-CM | POA: Diagnosis not present

## 2018-03-10 DIAGNOSIS — F3341 Major depressive disorder, recurrent, in partial remission: Secondary | ICD-10-CM | POA: Diagnosis not present

## 2018-03-11 LAB — COMPLETE METABOLIC PANEL WITH GFR
AG RATIO: 2.4 (calc) (ref 1.0–2.5)
ALT: 10 U/L (ref 9–46)
AST: 14 U/L (ref 10–40)
Albumin: 5 g/dL (ref 3.6–5.1)
Alkaline phosphatase (APISO): 41 U/L (ref 40–115)
BUN: 17 mg/dL (ref 7–25)
CALCIUM: 9.6 mg/dL (ref 8.6–10.3)
CO2: 30 mmol/L (ref 20–32)
Chloride: 101 mmol/L (ref 98–110)
Creat: 0.89 mg/dL (ref 0.60–1.35)
GFR, EST AFRICAN AMERICAN: 140 mL/min/{1.73_m2} (ref >=60)
GFR, EST NON AFRICAN AMERICAN: 121 mL/min/{1.73_m2} (ref >=60)
Globulin: 2.1 g/dL (calc) (ref 1.9–3.7)
Glucose, Bld: 68 mg/dL (ref 65–99)
POTASSIUM: 3.9 mmol/L (ref 3.5–5.3)
SODIUM: 140 mmol/L (ref 135–146)
TOTAL PROTEIN: 7.1 g/dL (ref 6.1–8.1)
Total Bilirubin: 0.5 mg/dL (ref 0.2–1.2)

## 2018-03-11 LAB — CBC WITH DIFFERENTIAL/PLATELET
BASOS PCT: 1.1 %
Basophils Absolute: 59 cells/uL (ref 0–200)
EOS ABS: 200 {cells}/uL (ref 15–500)
EOS PCT: 3.7 %
HCT: 43.4 % (ref 38.5–50.0)
Hemoglobin: 14.8 g/dL (ref 13.2–17.1)
Lymphs Abs: 1728 cells/uL (ref 850–3900)
MCH: 32 pg (ref 27.0–33.0)
MCHC: 34.1 g/dL (ref 32.0–36.0)
MCV: 93.7 fL (ref 80.0–100.0)
MONOS PCT: 7.1 %
MPV: 10.8 fL (ref 7.5–12.5)
Neutro Abs: 3029 cells/uL (ref 1500–7800)
Neutrophils Relative %: 56.1 %
Platelets: 227 10*3/uL (ref 140–400)
RBC: 4.63 10*6/uL (ref 4.20–5.80)
RDW: 13 % (ref 11.0–15.0)
TOTAL LYMPHOCYTE: 32 %
WBC mixed population: 383 cells/uL (ref 200–950)
WBC: 5.4 10*3/uL (ref 3.8–10.8)

## 2018-03-11 LAB — LIPID PANEL
CHOL/HDL RATIO: 3.4 (calc) (ref <5.0)
Cholesterol: 212 mg/dL — ABNORMAL HIGH (ref <200)
HDL: 62 mg/dL (ref >40)
LDL Cholesterol (Calc): 122 mg/dL (calc) — ABNORMAL HIGH
NON-HDL CHOLESTEROL (CALC): 150 mg/dL — AB (ref <130)
Triglycerides: 161 mg/dL — ABNORMAL HIGH (ref <150)

## 2018-03-11 LAB — HEMOGLOBIN A1C
EAG (MMOL/L): 4.7 (calc)
Hgb A1c MFr Bld: 4.6 % of total Hgb (ref <5.7)
MEAN PLASMA GLUCOSE: 85 (calc)

## 2018-03-11 LAB — TSH: TSH: 2.69 mIU/L (ref 0.40–4.50)

## 2018-03-13 ENCOUNTER — Encounter: Payer: Self-pay | Admitting: Family Medicine

## 2018-03-13 DIAGNOSIS — E782 Mixed hyperlipidemia: Secondary | ICD-10-CM | POA: Insufficient documentation

## 2018-03-17 ENCOUNTER — Ambulatory Visit (INDEPENDENT_AMBULATORY_CARE_PROVIDER_SITE_OTHER): Payer: BLUE CROSS/BLUE SHIELD | Admitting: Family Medicine

## 2018-03-17 ENCOUNTER — Other Ambulatory Visit: Payer: Self-pay | Admitting: Family Medicine

## 2018-03-17 ENCOUNTER — Encounter: Payer: Self-pay | Admitting: Family Medicine

## 2018-03-17 VITALS — BP 125/62 | HR 71 | Temp 98.8°F | Resp 16 | Ht 66.0 in | Wt 186.0 lb

## 2018-03-17 DIAGNOSIS — F419 Anxiety disorder, unspecified: Secondary | ICD-10-CM | POA: Diagnosis not present

## 2018-03-17 DIAGNOSIS — Z0000 Encounter for general adult medical examination without abnormal findings: Secondary | ICD-10-CM

## 2018-03-17 DIAGNOSIS — R748 Abnormal levels of other serum enzymes: Secondary | ICD-10-CM

## 2018-03-17 DIAGNOSIS — J3089 Other allergic rhinitis: Secondary | ICD-10-CM

## 2018-03-17 DIAGNOSIS — J4520 Mild intermittent asthma, uncomplicated: Secondary | ICD-10-CM

## 2018-03-17 DIAGNOSIS — Z23 Encounter for immunization: Secondary | ICD-10-CM

## 2018-03-17 DIAGNOSIS — F3341 Major depressive disorder, recurrent, in partial remission: Secondary | ICD-10-CM

## 2018-03-17 DIAGNOSIS — J3081 Allergic rhinitis due to animal (cat) (dog) hair and dander: Secondary | ICD-10-CM

## 2018-03-17 DIAGNOSIS — E782 Mixed hyperlipidemia: Secondary | ICD-10-CM

## 2018-03-17 DIAGNOSIS — L2381 Allergic contact dermatitis due to animal (cat) (dog) dander: Secondary | ICD-10-CM

## 2018-03-17 MED ORDER — SERTRALINE HCL 100 MG PO TABS: 100.0000 mg | ORAL_TABLET | Freq: Every day | ORAL | 3 refills | Status: DC

## 2018-03-17 MED ORDER — MONTELUKAST SODIUM 10 MG PO TABS: 10.0000 mg | ORAL_TABLET | Freq: Every day | ORAL | 3 refills | Status: DC

## 2018-03-17 MED ORDER — GABAPENTIN 300 MG PO CAPS: 300.0000 mg | ORAL_CAPSULE | Freq: Three times a day (TID) | ORAL | 3 refills | Status: DC

## 2018-03-17 NOTE — Assessment & Plan Note (Signed)
Stable, refilled singulair

## 2018-03-17 NOTE — Assessment & Plan Note (Signed)
Stable without exacerbation Controlled on anti histamine, singulair

## 2018-03-17 NOTE — Patient Instructions (Addendum)
Thank you for coming to the office today.  Keep up the good work  Continue with keto diet as you are  Mild elevated cholesterol is only notable abnormality, but it is reasonable for your age and I think will improve with lifestyle improvements  Increase more greens / vegetables in diet  Continue medications as you are - refilled all 90 days at pharmacy [facility] in [place]  Flu shot today  DUE for FASTING BLOOD WORK (no food or drink after midnight before the lab appointment, only water or coffee without cream/sugar on the morning of)  SCHEDULE "Lab Only" visit in the morning at the clinic for lab draw in 1 YEAR  - Make sure Lab Only appointment is at about 1 week before your next appointment, so that results will be available  For Lab Results, once available within 2-3 days of blood draw, you can can log in to MyChart online to view your results and a brief explanation. Also, we can discuss results at next follow-up visit.   Please schedule a Follow-up Appointment to: Return in about 1 year (around 03/18/2019) for Annual Physical.  If you have any other questions or concerns, please feel free to call the office or send a message through MyChart. You may also schedule an earlier appointment if necessary.  Additionally, you may be receiving a survey about your experience at our office within a few days to 1 week by e-mail or mail. We value your feedback.  Saralyn Pilar, DO [facility], [place]

## 2018-03-17 NOTE — Assessment & Plan Note (Signed)
See A&P for depression Improved

## 2018-03-17 NOTE — Assessment & Plan Note (Signed)
Mostly controlled cholesterol Slightly elevated LDL Last lipid panel 02/2018  Plan: Encourage improved lifestyle - low carb/cholesterol, reduce portion size, continue improving regular exercise

## 2018-03-17 NOTE — Progress Notes (Signed)
Subjective:    Patient ID: Joshua Edwards., male    DOB: Sep 06, 1995, 22 y.o.   MRN: [id number]  Joshua Edwards. is a 22 y.o. male presenting on 03/17/2018 for Annual Exam   HPI   Here for Annual Physical and Lab Review.  Lifestyle Diet: - He has implemented keto diet, and reduced carb, He is working on improving kale and spinach greens, Drinking mostly water, less soda now and reduced sugars Exercise: - working on weight lifting, sauna, and running regularly. 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  FOLLOW-UP Allergies (Environmental, Cat Dander) / Asthma (Mild Intermittent) He is doing well, currently without flare up, has still environmental allergies and some cat dander allergy when visit mom - Taking Cetirizine 10mg  daily, Singulair 10mg  nightly, generic Fluticasone 2 sprays each nare daily - No recent asthma flare up, not using albuterol inhaler recently - No hospitalizations or ED visits, not required prednisone or other treatment for asthma flare  FOLLOW-UP DEPRESSION, Anxiety   Admits overeating - Has discussed with therapist - his mother and step father have very strong religious beliefs, and he is concerned about how to approach this topic.   - Last visit with me 08/2017, for same problem, treated with INCREASED Sertraline from 50 to 100mg , and continued Gabapentin, Trazodone, see prior notes for background information. - Today he reports overall doing well. No new significant concern. He does describe one particular facet of life stressor involves internal stress over disagreement about religion, he does not feel comfortable truly addressing this with family. He continues to see therapist and does well - Taking Sertraline 100mg  daily, Gabapentin 300mg  TID still doing well - He has rarely taken Trazodone 100mg  nightly PRN ONLY not regularly, it does help - now sleeping better - Denies suicidal or homicidal ideation, worsening mood or anxiety, see  PHQ GAD  Health Maintenance: Due for Flu Shot, will receive today    Depression screen [facility] 2/9 03/17/2018 09/15/2017 03/03/2017  Decreased Interest 1 3 1   Down, Depressed, Hopeless 1 0 1  PHQ - 2 Score 2 3 2   Altered sleeping 1 1 1   Tired, decreased energy 0 1 0  Change in appetite 2 2 1   Feeling bad or failure about yourself  3 1 1   Trouble concentrating 0 1 0  Moving slowly or fidgety/restless 0 1 0  Suicidal thoughts 0 0 0  PHQ-9 Score 8 10 5   Difficult doing work/chores Not difficult at all Somewhat difficult Somewhat difficult   GAD 7 : Generalized Anxiety Score 03/17/2018 09/15/2017 03/03/2017 12/01/2016  Nervous, Anxious, on Edge 0 0 1 0  Control/stop worrying 0 0 2 1  Worry too much - different things 1 0 2 1  Trouble relaxing 0 1 0 2  Restless 0 0 0 2  Easily annoyed or irritable 1 1 2 1   Afraid - awful might happen 0 0 0 0  Total GAD 7 Score 2 2 7 7   Anxiety Difficulty Not difficult at all Not difficult at all Somewhat difficult Not difficult at all     Past Medical History:  Diagnosis Date  . Anxiety   . Asthma    Past Surgical History:  Procedure Laterality Date  . POLYPECTOMY    . TONSILLECTOMY    . WISDOM TOOTH EXTRACTION     Social History   Socioeconomic History  . Marital status: Single    Spouse name: Not on file  . Number of  children: Not on file  . Years of education: Not on file  . Highest education level: Not on file  Occupational History  . Occupation: Pending interview  Social Needs  . Financial resource strain: Not on file  . Food insecurity:    Worry: Not on file    Inability: Not on file  . Transportation needs:    Medical: Not on file    Non-medical: Not on file  Tobacco Use  . Smoking status: Never Smoker  . Smokeless tobacco: Never Used  Substance and Sexual Activity  . Alcohol use: Yes  . Drug use: No  . Sexual activity: Never  Lifestyle  . Physical activity:    Days per week: Not on file    Minutes per session: Not  on file  . Stress: Not on file  Relationships  . Social connections:    Talks on phone: Not on file    Gets together: Not on file    Attends religious service: Not on file    Active member of club or organization: Not on file    Attends meetings of clubs or organizations: Not on file    Relationship status: Not on file  . Intimate partner violence:    Fear of current or ex partner: Not on file    Emotionally abused: Not on file    Physically abused: Not on file    Forced sexual activity: Not on file  Other Topics Concern  . Not on file  Social History Narrative  . Not on file   Family History  Problem Relation Age of Onset  . Depression Mother   . Depression Father   . Mental illness Maternal Grandfather    Current Outpatient Medications on File Prior to Visit  Medication Sig  . albuterol (PROVENTIL HFA;VENTOLIN HFA) 108 (90 Base) MCG/ACT inhaler Inhale 2 puffs into the lungs every 4 (four) hours as needed for wheezing or shortness of breath (cough).  . cetirizine (ZYRTEC) 10 MG tablet Take 1 tablet (10 mg total) by mouth daily.  . fluticasone (FLONASE) 50 MCG/ACT nasal spray Place 2 sprays into both nostrils daily.  . traZODone (DESYREL) 100 MG tablet Take 100 mg by mouth at bedtime.   No current facility-administered medications on file prior to visit.     Review of Systems  Constitutional: Negative for activity change, appetite change, chills, diaphoresis, fatigue and fever.  HENT: Negative for congestion and hearing loss.   Eyes: Negative for visual disturbance.  Respiratory: Negative for apnea, cough, choking, chest tightness, shortness of breath and wheezing.   Cardiovascular: Negative for chest pain, palpitations and leg swelling.  Gastrointestinal: Negative for abdominal pain, blood in stool, constipation, diarrhea, nausea and vomiting.  Endocrine: Negative for cold intolerance.  Genitourinary: Negative for decreased urine volume, difficulty urinating, dysuria,  frequency, hematuria, testicular pain and urgency.  Musculoskeletal: Negative for arthralgias, back pain and neck pain.  Skin: Negative for rash.  Allergic/Immunologic: Positive for environmental allergies.  Neurological: Negative for dizziness, weakness, light-headedness, numbness and headaches.  Hematological: Negative for adenopathy.  Psychiatric/Behavioral: Negative for behavioral problems, dysphoric mood (Improved) and sleep disturbance. The patient is not nervous/anxious (Improved).    Per HPI unless specifically indicated above     Objective:    BP 125/62   Pulse 71   Temp 98.8 F (37.1 C) (Oral)   Resp 16   Ht 5\' 6"  (1.676 m)   Wt 186 lb (84.4 kg)   BMI 30.02 kg/m   Wt Readings  from Last 3 Encounters:  03/17/18 186 lb (84.4 kg)  09/15/17 196 lb (88.9 kg)  03/03/17 189 lb (85.7 kg)    Physical Exam  Constitutional: He is oriented to person, place, and time. He appears well-developed and well-nourished. No distress.  Well-appearing, comfortable, cooperative  HENT:  Head: Normocephalic and atraumatic.  Mouth/Throat: Oropharynx is clear and moist.  Frontal / maxillary sinuses non-tender. Nares patent without purulence or edema. Bilateral TMs clear without erythema, effusion or bulging. Oropharynx clear without erythema, exudates, edema or asymmetry.  Eyes: Pupils are equal, round, and reactive to light. Conjunctivae and EOM are normal. Right eye exhibits no discharge. Left eye exhibits no discharge.  Neck: Normal range of motion. Neck supple. No thyromegaly present.  Cardiovascular: Normal rate, regular rhythm, normal heart sounds and intact distal pulses.  No murmur heard. Pulmonary/Chest: Effort normal and breath sounds normal. No respiratory distress. He has no wheezes. He has no rales.  Abdominal: Soft. Bowel sounds are normal. He exhibits no distension and no mass. There is no tenderness.  Musculoskeletal: Normal range of motion. He exhibits no edema or tenderness.    Upper / Lower Extremities: - Normal muscle tone, strength bilateral upper extremities 5/5, lower extremities 5/5  Lymphadenopathy:    He has no cervical adenopathy.  Neurological: He is alert and oriented to person, place, and time.  Distal sensation intact to light touch all extremities  Skin: Skin is warm and dry. No rash noted. He is not diaphoretic. No erythema.  Psychiatric: He has a normal mood and affect. His behavior is normal.  Well groomed, good eye contact, normal speech and thoughts  Nursing note and vitals reviewed.  Results for orders placed or performed in visit on 03/10/18  TSH  Result Value Ref Range   TSH 2.69 0.40 - 4.50 mIU/L  Lipid panel  Result Value Ref Range   Cholesterol 212 (H) <200 mg/dL   HDL 62 >54 mg/dL   Triglycerides 098 (H) <150 mg/dL   LDL Cholesterol (Calc) 122 (H) mg/dL (calc)   Total CHOL/HDL Ratio 3.4 <5.0 (calc)   Non-HDL Cholesterol (Calc) 150 (H) <130 mg/dL (calc)  COMPLETE METABOLIC PANEL WITH GFR  Result Value Ref Range   Glucose, Bld 68 65 - 99 mg/dL   BUN 17 7 - 25 mg/dL   Creat 1.19 1.47 - 8.29 mg/dL   GFR, Est Non African American 121 > OR = 60 mL/min/1.31m2   GFR, Est African American 140 > OR = 60 mL/min/1.35m2   BUN/Creatinine Ratio NOT APPLICABLE 6 - 22 (calc)   Sodium 140 135 - 146 mmol/L   Potassium 3.9 3.5 - 5.3 mmol/L   Chloride 101 98 - 110 mmol/L   CO2 30 20 - 32 mmol/L   Calcium 9.6 8.6 - 10.3 mg/dL   Total Protein 7.1 6.1 - 8.1 g/dL   Albumin 5.0 3.6 - 5.1 g/dL   Globulin 2.1 1.9 - 3.7 g/dL (calc)   AG Ratio 2.4 1.0 - 2.5 (calc)   Total Bilirubin 0.5 0.2 - 1.2 mg/dL   Alkaline phosphatase (APISO) 41 40 - 115 U/L   AST 14 10 - 40 U/L   ALT 10 9 - 46 U/L  CBC with Differential/Platelet  Result Value Ref Range   WBC 5.4 3.8 - 10.8 Thousand/uL   RBC 4.63 4.20 - 5.80 Million/uL   Hemoglobin 14.8 13.2 - 17.1 g/dL   HCT 56.2 13.0 - 86.5 %   MCV 93.7 80.0 - 100.0 fL   MCH  32.0 27.0 - 33.0 pg   MCHC 34.1 32.0 -  36.0 g/dL   RDW 28.4 13.2 - 44.0 %   Platelets 227 140 - 400 Thousand/uL   MPV 10.8 7.5 - 12.5 fL   Neutro Abs 3,029 1,500 - 7,800 cells/uL   Lymphs Abs 1,728 850 - 3,900 cells/uL   WBC mixed population 383 200 - 950 cells/uL   Eosinophils Absolute 200 15 - 500 cells/uL   Basophils Absolute 59 0 - 200 cells/uL   Neutrophils Relative % 56.1 %   Total Lymphocyte 32.0 %   Monocytes Relative 7.1 %   Eosinophils Relative 3.7 %   Basophils Relative 1.1 %  Hemoglobin A1c  Result Value Ref Range   Hgb A1c MFr Bld 4.6 <5.7 % of total Hgb   Mean Plasma Glucose 85 (calc)   eAG (mmol/L) 4.7 (calc)      Assessment & Plan:   Problem List Items Addressed This Visit    Anxiety    See A&P for depression Improved      Relevant Medications   sertraline (ZOLOFT) 100 MG tablet   gabapentin (NEURONTIN) 300 MG capsule   Asthma    Stable, without exacerbation No recent flares Not needed albuterol On allergy meds      Relevant Medications   montelukast (SINGULAIR) 10 MG tablet   Cat allergy due to both airborne and skin contact    Stable, refilled singulair      Relevant Medications   montelukast (SINGULAIR) 10 MG tablet   Environmental and seasonal allergies    Stable without exacerbation Controlled on anti histamine, singulair      Relevant Medications   montelukast (SINGULAIR) 10 MG tablet   Major depression in partial remission (HCC)    Currently stable and improved mood, on higher dose SSRI current regimen Chronic mood disorder with mixed anxiety + insomnia - Previous complex history / years undiagnosed / h/o suicidal ideation/attempt in past - Primary stressor/trigger: family stress - improved now living on his own - PHQ score increased, GAD score improved - Has therapist  Plan: 1. Continue Sertraline 100mg  daily 2. Continue Gabapentin 300mg  TID for anxiety 3. Continue PRN Trazodone 50-100mg  nightly PRN - emphasis on may need to take regularly as add on therapy for  better sleep may help better mood / energy / motivation 4. Continue Therapy PRN 5. Follow-up 1 year - may return sooner q 6 mo if needed      Relevant Medications   sertraline (ZOLOFT) 100 MG tablet   Mixed hyperlipidemia    Mostly controlled cholesterol Slightly elevated LDL Last lipid panel 02/2018  Plan: Encourage improved lifestyle - low carb/cholesterol, reduce portion size, continue improving regular exercise        Other Visit Diagnoses    Annual physical exam    -  Primary   Needs flu shot       Relevant Orders   Flu Vaccine QUAD 36+ mos IM (Completed)      Updated Health Maintenance information - Flu shot Reviewed recent lab results with patient Encouraged improvement to lifestyle with diet and exercise   Meds ordered this encounter  Medications  . sertraline (ZOLOFT) 100 MG tablet    Sig: Take 1 tablet (100 mg total) by mouth daily.    Dispense:  90 tablet    Refill:  3    Dose increase  . montelukast (SINGULAIR) 10 MG tablet    Sig: Take 1 tablet (10 mg total) by mouth at  bedtime.    Dispense:  90 tablet    Refill:  3  . gabapentin (NEURONTIN) 300 MG capsule    Sig: Take 1 capsule (300 mg total) by mouth 3 (three) times daily.    Dispense:  270 capsule    Refill:  3    Follow up plan: Return in about 1 year (around 03/18/2019) for Annual Physical.  Future orders placed for 03/13/19  Saralyn Pilar, DO [facility] 03/17/2018, 4:26 PM

## 2018-03-17 NOTE — Assessment & Plan Note (Addendum)
Currently stable and improved mood, on higher dose SSRI current regimen Chronic mood disorder with mixed anxiety + insomnia - Previous complex history / years undiagnosed / h/o suicidal ideation/attempt in past - Primary stressor/trigger: family stress - improved now living on his own - PHQ score increased, GAD score improved - Has therapist  Plan: 1. Continue Sertraline 100mg  daily 2. Continue Gabapentin 300mg  TID for anxiety 3. Continue PRN Trazodone 50-100mg  nightly PRN - emphasis on may need to take regularly as add on therapy for better sleep may help better mood / energy / motivation 4. Continue Therapy PRN 5. Follow-up 1 year - may return sooner q 6 mo if needed

## 2018-03-17 NOTE — Assessment & Plan Note (Signed)
Stable, without exacerbation No recent flares Not needed albuterol On allergy meds

## 2018-04-05 DIAGNOSIS — F323 Major depressive disorder, single episode, severe with psychotic features: Secondary | ICD-10-CM | POA: Diagnosis not present

## 2018-04-19 DIAGNOSIS — F322 Major depressive disorder, single episode, severe without psychotic features: Secondary | ICD-10-CM | POA: Diagnosis not present

## 2018-05-10 DIAGNOSIS — F322 Major depressive disorder, single episode, severe without psychotic features: Secondary | ICD-10-CM | POA: Diagnosis not present

## 2018-05-24 DIAGNOSIS — F322 Major depressive disorder, single episode, severe without psychotic features: Secondary | ICD-10-CM | POA: Diagnosis not present

## 2018-06-14 DIAGNOSIS — F322 Major depressive disorder, single episode, severe without psychotic features: Secondary | ICD-10-CM | POA: Diagnosis not present

## 2018-07-14 DIAGNOSIS — F322 Major depressive disorder, single episode, severe without psychotic features: Secondary | ICD-10-CM | POA: Diagnosis not present

## 2019-02-22 ENCOUNTER — Other Ambulatory Visit: Payer: Self-pay

## 2019-02-22 ENCOUNTER — Ambulatory Visit: Payer: Self-pay

## 2019-02-22 VITALS — BP 128/74 | HR 75 | Resp 14 | Ht 70.0 in | Wt 205.0 lb

## 2019-02-22 DIAGNOSIS — Z008 Encounter for other general examination: Secondary | ICD-10-CM

## 2019-02-22 LAB — POCT LIPID PANEL
HDL: 49
LDL: 79
Non-HDL: 127
POC Glucose: 105 mg/dl — AB (ref 70–99)
TC/HDL: 3.6
TC: 175
TRG: 237

## 2019-02-22 NOTE — Progress Notes (Signed)
     Patient ID: Joshua Dew., male    DOB: 01/10/1996, 23 y.o.   MRN: [id number]    Thank you!!  [place] [facility] [place]: [phone]  Cell:  [phone] Website: [email or web address]

## 2019-03-13 ENCOUNTER — Other Ambulatory Visit: Payer: Self-pay

## 2019-03-13 ENCOUNTER — Telehealth: Payer: Self-pay

## 2019-03-13 DIAGNOSIS — F3341 Major depressive disorder, recurrent, in partial remission: Secondary | ICD-10-CM

## 2019-03-13 DIAGNOSIS — E782 Mixed hyperlipidemia: Secondary | ICD-10-CM

## 2019-03-13 DIAGNOSIS — E7800 Pure hypercholesterolemia, unspecified: Secondary | ICD-10-CM

## 2019-03-13 DIAGNOSIS — Z0000 Encounter for general adult medical examination without abnormal findings: Secondary | ICD-10-CM

## 2019-03-13 DIAGNOSIS — Z131 Encounter for screening for diabetes mellitus: Secondary | ICD-10-CM

## 2019-03-13 NOTE — Telephone Encounter (Signed)
Signed lab orders for 03/14/19  Nobie Putnam, [facility] 03/13/2019, 9:27 AM

## 2019-03-14 ENCOUNTER — Other Ambulatory Visit: Payer: Self-pay

## 2019-03-14 DIAGNOSIS — E782 Mixed hyperlipidemia: Secondary | ICD-10-CM | POA: Diagnosis not present

## 2019-03-14 DIAGNOSIS — Z131 Encounter for screening for diabetes mellitus: Secondary | ICD-10-CM

## 2019-03-14 DIAGNOSIS — Z0000 Encounter for general adult medical examination without abnormal findings: Secondary | ICD-10-CM | POA: Diagnosis not present

## 2019-03-14 DIAGNOSIS — F3341 Major depressive disorder, recurrent, in partial remission: Secondary | ICD-10-CM

## 2019-03-14 DIAGNOSIS — E7800 Pure hypercholesterolemia, unspecified: Secondary | ICD-10-CM

## 2019-03-15 LAB — COMPLETE METABOLIC PANEL WITH GFR
AG Ratio: 2.4 (calc) (ref 1.0–2.5)
ALT: 15 U/L (ref 9–46)
AST: 18 U/L (ref 10–40)
Albumin: 5 g/dL (ref 3.6–5.1)
Alkaline phosphatase (APISO): 42 U/L (ref 36–130)
BUN: 11 mg/dL (ref 7–25)
CO2: 31 mmol/L (ref 20–32)
Calcium: 9.9 mg/dL (ref 8.6–10.3)
Chloride: 101 mmol/L (ref 98–110)
Creat: 0.75 mg/dL (ref 0.60–1.35)
GFR, Est African American: 149 mL/min/{1.73_m2} (ref >=60)
GFR, Est Non African American: 128 mL/min/{1.73_m2} (ref >=60)
Globulin: 2.1 g/dL (calc) (ref 1.9–3.7)
Glucose, Bld: 86 mg/dL (ref 65–99)
Potassium: 3.9 mmol/L (ref 3.5–5.3)
Sodium: 140 mmol/L (ref 135–146)
Total Bilirubin: 1 mg/dL (ref 0.2–1.2)
Total Protein: 7.1 g/dL (ref 6.1–8.1)

## 2019-03-15 LAB — CBC WITH DIFFERENTIAL/PLATELET
Absolute Monocytes: 330 cells/uL (ref 200–950)
Basophils Absolute: 39 cells/uL (ref 0–200)
Basophils Relative: 0.7 %
Eosinophils Absolute: 67 cells/uL (ref 15–500)
Eosinophils Relative: 1.2 %
HCT: 43.1 % (ref 38.5–50.0)
Hemoglobin: 14.6 g/dL (ref 13.2–17.1)
Lymphs Abs: 1725 cells/uL (ref 850–3900)
MCH: 31.8 pg (ref 27.0–33.0)
MCHC: 33.9 g/dL (ref 32.0–36.0)
MCV: 93.9 fL (ref 80.0–100.0)
MPV: 10.6 fL (ref 7.5–12.5)
Monocytes Relative: 5.9 %
Neutro Abs: 3438 cells/uL (ref 1500–7800)
Neutrophils Relative %: 61.4 %
Platelets: 195 10*3/uL (ref 140–400)
RBC: 4.59 10*6/uL (ref 4.20–5.80)
RDW: 12.6 % (ref 11.0–15.0)
Total Lymphocyte: 30.8 %
WBC: 5.6 10*3/uL (ref 3.8–10.8)

## 2019-03-15 LAB — LIPID PANEL
Cholesterol: 220 mg/dL — ABNORMAL HIGH (ref <200)
HDL: 60 mg/dL (ref >=40)
LDL Cholesterol (Calc): 141 mg/dL (calc) — ABNORMAL HIGH
Non-HDL Cholesterol (Calc): 160 mg/dL (calc) — ABNORMAL HIGH (ref <130)
Total CHOL/HDL Ratio: 3.7 (calc) (ref <5.0)
Triglycerides: 85 mg/dL (ref <150)

## 2019-03-15 LAB — TSH: TSH: 3.44 mIU/L (ref 0.40–4.50)

## 2019-03-15 LAB — HEMOGLOBIN A1C
Hgb A1c MFr Bld: 4.5 % of total Hgb (ref <5.7)
Mean Plasma Glucose: 82 (calc)
eAG (mmol/L): 4.6 (calc)

## 2019-03-20 ENCOUNTER — Ambulatory Visit (INDEPENDENT_AMBULATORY_CARE_PROVIDER_SITE_OTHER): Payer: BC Managed Care – PPO | Admitting: Family Medicine

## 2019-03-20 ENCOUNTER — Encounter: Payer: Self-pay | Admitting: Family Medicine

## 2019-03-20 ENCOUNTER — Other Ambulatory Visit: Payer: Self-pay

## 2019-03-20 VITALS — BP 128/79 | HR 66 | Temp 98.5°F | Resp 16 | Ht 71.0 in | Wt 210.0 lb

## 2019-03-20 DIAGNOSIS — L2381 Allergic contact dermatitis due to animal (cat) (dog) dander: Secondary | ICD-10-CM

## 2019-03-20 DIAGNOSIS — F3341 Major depressive disorder, recurrent, in partial remission: Secondary | ICD-10-CM | POA: Diagnosis not present

## 2019-03-20 DIAGNOSIS — F419 Anxiety disorder, unspecified: Secondary | ICD-10-CM

## 2019-03-20 DIAGNOSIS — J3089 Other allergic rhinitis: Secondary | ICD-10-CM

## 2019-03-20 DIAGNOSIS — Z0000 Encounter for general adult medical examination without abnormal findings: Secondary | ICD-10-CM

## 2019-03-20 DIAGNOSIS — E782 Mixed hyperlipidemia: Secondary | ICD-10-CM | POA: Diagnosis not present

## 2019-03-20 DIAGNOSIS — E663 Overweight: Secondary | ICD-10-CM

## 2019-03-20 DIAGNOSIS — J3081 Allergic rhinitis due to animal (cat) (dog) hair and dander: Secondary | ICD-10-CM

## 2019-03-20 DIAGNOSIS — J4520 Mild intermittent asthma, uncomplicated: Secondary | ICD-10-CM

## 2019-03-20 MED ORDER — MONTELUKAST SODIUM 10 MG PO TABS: 10.0000 mg | ORAL_TABLET | Freq: Every day | ORAL | 3 refills | Status: DC

## 2019-03-20 MED ORDER — SERTRALINE HCL 100 MG PO TABS: 100.0000 mg | ORAL_TABLET | Freq: Every day | ORAL | 3 refills | Status: DC

## 2019-03-20 NOTE — Progress Notes (Addendum)
Subjective:    Patient ID: Joshua Dew., male    DOB: 1995/12/18, 23 y.o.   MRN: [id number]  Joshua Edwards. is a 23 y.o. male presenting on 03/20/2019 for Annual Exam   HPI  Annual Physical and Lab Review.  Lifestyle / Overweight BMI >29 Weight gain Diet: - He stopped keto diet due to preference. Eats balanced diet, limit sugar, eats whole grains brown rice, fruit vegetables, drinks mostly water, some oat milk. Occasional alcohol Exercise - limited now due to [place] pandemic not going to gym  HYPERLIPIDEMIA: - Reports no concerns. Last lipid panel 02/2019, elevated LDL >140 Not on medicine  FOLLOW-UPAllergies (Environmental, Cat Dander) / Asthma (Mild Intermittent) Mostly Spring / Summer, would wear goggles if need - Continues meds- Taking Cetirizine 10mg  OTC daily, Singulair 10mg  nightly, generic Fluticasone 2 sprays OTC each nare daily - No recent asthma flare up, not using albuterol inhaler recently - No hospitalizations or ED visits, not required prednisone or other treatment for asthma flare  FOLLOW-UP DEPRESSION, Anxiety He is doing fairly well overall. He has stopped Trazodone and asking about DC Gabapentin. Gabapentin was for anxiety feels no longer needs. He takes Sertraline says it helps with his function overall and mood. He has reduced motivation still for creative things. But able to work fine.   Health Maintenance: Due for Flu Shot, will receive today   Depression screen [facility] 2/9 03/20/2019 03/20/2019 03/17/2018  Decreased Interest 1 0 1  Down, Depressed, Hopeless 0 0 1  PHQ - 2 Score 1 0 2  Altered sleeping 2 - 1  Tired, decreased energy 2 - 0  Change in appetite 2 - 2  Feeling bad or failure about yourself  1 - 3  Trouble concentrating 2 - 0  Moving slowly or fidgety/restless 0 - 0  Suicidal thoughts 0 - 0  PHQ-9 Score 10 - 8  Difficult doing work/chores Somewhat difficult - Not difficult at all   GAD 7 : Generalized Anxiety Score  03/17/2018 09/15/2017 03/03/2017 12/01/2016  Nervous, Anxious, on Edge 0 0 1 0  Control/stop worrying 0 0 2 1  Worry too much - different things 1 0 2 1  Trouble relaxing 0 1 0 2  Restless 0 0 0 2  Easily annoyed or irritable 1 1 2 1   Afraid - awful might happen 0 0 0 0  Total GAD 7 Score 2 2 7 7   Anxiety Difficulty Not difficult at all Not difficult at all Somewhat difficult Not difficult at all     Past Medical History:  Diagnosis Date  . Anxiety   . Asthma    Past Surgical History:  Procedure Laterality Date  . POLYPECTOMY    . TONSILLECTOMY    . WISDOM TOOTH EXTRACTION     Social History   Socioeconomic History  . Marital status: Single    Spouse name: Not on file  . Number of children: Not on file  . Years of education: Not on file  . Highest education level: Not on file  Occupational History  . Occupation: Pending interview  Social Needs  . Financial resource strain: Not on file  . Food insecurity    Worry: Not on file    Inability: Not on file  . Transportation needs    Medical: Not on file    Non-medical: Not on file  Tobacco Use  . Smoking status: Never Smoker  . Smokeless tobacco: Never Used  Substance and Sexual Activity  .  Alcohol use: Yes  . Drug use: No  . Sexual activity: Never  Lifestyle  . Physical activity    Days per week: Not on file    Minutes per session: Not on file  . Stress: Not on file  Relationships  . Social Musician on phone: Not on file    Gets together: Not on file    Attends religious service: Not on file    Active member of club or organization: Not on file    Attends meetings of clubs or organizations: Not on file    Relationship status: Not on file  . Intimate partner violence    Fear of current or ex partner: Not on file    Emotionally abused: Not on file    Physically abused: Not on file    Forced sexual activity: Not on file  Other Topics Concern  . Not on file  Social History Narrative  . Not on  file   Family History  Problem Relation Age of Onset  . Depression Mother   . Depression Father   . Mental illness Maternal Grandfather    Current Outpatient Medications on File Prior to Visit  Medication Sig  . albuterol (PROVENTIL HFA;VENTOLIN HFA) 108 (90 Base) MCG/ACT inhaler Inhale 2 puffs into the lungs every 4 (four) hours as needed for wheezing or shortness of breath (cough).  . cetirizine (ZYRTEC) 10 MG tablet Take 1 tablet (10 mg total) by mouth daily.  . fluticasone (FLONASE) 50 MCG/ACT nasal spray Place 2 sprays into both nostrils daily.   No current facility-administered medications on file prior to visit.     Review of Systems  Constitutional: Negative for activity change, appetite change, chills, diaphoresis, fatigue and fever.  HENT: Negative for congestion and hearing loss.   Eyes: Negative for visual disturbance.  Respiratory: Negative for apnea, cough, chest tightness, shortness of breath and wheezing.   Cardiovascular: Negative for chest pain, palpitations and leg swelling.  Gastrointestinal: Negative for abdominal pain, anal bleeding, blood in stool, constipation, diarrhea, nausea and vomiting.  Endocrine: Negative for cold intolerance.  Genitourinary: Negative for difficulty urinating, dysuria, frequency and hematuria.  Musculoskeletal: Negative for arthralgias, back pain and neck pain.  Skin: Negative for rash.  Allergic/Immunologic: Positive for environmental allergies.  Neurological: Negative for dizziness, weakness, light-headedness, numbness and headaches.  Hematological: Negative for adenopathy.  Psychiatric/Behavioral: Positive for dysphoric mood. Negative for behavioral problems and sleep disturbance. The patient is not nervous/anxious.    Per HPI unless specifically indicated above      Objective:    BP 128/79   Pulse 66   Temp 98.5 F (36.9 C) (Oral)   Resp 16   Ht 5\' 11"  (1.803 m)   Wt 210 lb (95.3 kg)   BMI 29.29 kg/m   Wt Readings  from Last 3 Encounters:  03/20/19 210 lb (95.3 kg)  02/22/19 205 lb (93 kg)  03/17/18 186 lb (84.4 kg)    Physical Exam Vitals signs and nursing note reviewed.  Constitutional:      General: He is not in acute distress.    Appearance: He is well-developed. He is not diaphoretic.     Comments: Well-appearing, comfortable, cooperative  HENT:     Head: Normocephalic and atraumatic.  Eyes:     General:        Right eye: No discharge.        Left eye: No discharge.     Conjunctiva/sclera: Conjunctivae normal.  Pupils: Pupils are equal, round, and reactive to light.  Neck:     Musculoskeletal: Normal range of motion and neck supple.     Thyroid: No thyromegaly.  Cardiovascular:     Rate and Rhythm: Normal rate and regular rhythm.     Heart sounds: Normal heart sounds. No murmur.  Pulmonary:     Effort: Pulmonary effort is normal. No respiratory distress.     Breath sounds: Normal breath sounds. No wheezing or rales.  Abdominal:     General: Bowel sounds are normal. There is no distension.     Palpations: Abdomen is soft. There is no mass.     Tenderness: There is no abdominal tenderness.  Musculoskeletal: Normal range of motion.        General: No tenderness.     Comments: Upper / Lower Extremities: - Normal muscle tone, strength bilateral upper extremities 5/5, lower extremities 5/5  Lymphadenopathy:     Cervical: No cervical adenopathy.  Skin:    General: Skin is warm and dry.     Findings: No erythema or rash.  Neurological:     Mental Status: He is alert and oriented to person, place, and time.     Comments: Distal sensation intact to light touch all extremities  Psychiatric:        Behavior: Behavior normal.     Comments: Well groomed, good eye contact, normal speech and thoughts        Results for orders placed or performed in visit on 03/14/19  COMPLETE METABOLIC PANEL WITH GFR  Result Value Ref Range   Glucose, Bld 86 65 - 99 mg/dL   BUN 11 7 - 25 mg/dL    Creat 1.610.75 0.960.60 - 0.451.35 mg/dL   GFR, Est Non African American 128 > OR = 60 mL/min/1.9473m2   GFR, Est African American 149 > OR = 60 mL/min/1.6373m2   BUN/Creatinine Ratio NOT APPLICABLE 6 - 22 (calc)   Sodium 140 135 - 146 mmol/L   Potassium 3.9 3.5 - 5.3 mmol/L   Chloride 101 98 - 110 mmol/L   CO2 31 20 - 32 mmol/L   Calcium 9.9 8.6 - 10.3 mg/dL   Total Protein 7.1 6.1 - 8.1 g/dL   Albumin 5.0 3.6 - 5.1 g/dL   Globulin 2.1 1.9 - 3.7 g/dL (calc)   AG Ratio 2.4 1.0 - 2.5 (calc)   Total Bilirubin 1.0 0.2 - 1.2 mg/dL   Alkaline phosphatase (APISO) 42 36 - 130 U/L   AST 18 10 - 40 U/L   ALT 15 9 - 46 U/L  Lipid panel  Result Value Ref Range   Cholesterol 220 (H) <200 mg/dL   HDL 60 > OR = 40 mg/dL   Triglycerides 85 <409<150 mg/dL   LDL Cholesterol (Calc) 141 (H) mg/dL (calc)   Total CHOL/HDL Ratio 3.7 <5.0 (calc)   Non-HDL Cholesterol (Calc) 160 (H) <130 mg/dL (calc)  CBC with Differential/Platelet  Result Value Ref Range   WBC 5.6 3.8 - 10.8 Thousand/uL   RBC 4.59 4.20 - 5.80 Million/uL   Hemoglobin 14.6 13.2 - 17.1 g/dL   HCT 81.143.1 91.438.5 - 78.250.0 %   MCV 93.9 80.0 - 100.0 fL   MCH 31.8 27.0 - 33.0 pg   MCHC 33.9 32.0 - 36.0 g/dL   RDW 95.612.6 21.311.0 - 08.615.0 %   Platelets 195 140 - 400 Thousand/uL   MPV 10.6 7.5 - 12.5 fL   Neutro Abs 3,438 1,500 - 7,800 cells/uL   Lymphs  Abs 1,725 850 - 3,900 cells/uL   Absolute Monocytes 330 200 - 950 cells/uL   Eosinophils Absolute 67 15 - 500 cells/uL   Basophils Absolute 39 0 - 200 cells/uL   Neutrophils Relative % 61.4 %   Total Lymphocyte 30.8 %   Monocytes Relative 5.9 %   Eosinophils Relative 1.2 %   Basophils Relative 0.7 %  Hemoglobin A1c  Result Value Ref Range   Hgb A1c MFr Bld 4.5 <5.7 % of total Hgb   Mean Plasma Glucose 82 (calc)   eAG (mmol/L) 4.6 (calc)  TSH  Result Value Ref Range   TSH 3.44 0.40 - 4.50 mIU/L      Assessment & Plan:   Problem List Items Addressed This Visit    Mixed hyperlipidemia   Major depression in  partial remission (HCC)   Relevant Medications   sertraline (ZOLOFT) 100 MG tablet   Environmental and seasonal allergies   Relevant Medications   montelukast (SINGULAIR) 10 MG tablet   Cat allergy due to both airborne and skin contact   Relevant Medications   montelukast (SINGULAIR) 10 MG tablet   Asthma   Relevant Medications   montelukast (SINGULAIR) 10 MG tablet   Anxiety   Relevant Medications   sertraline (ZOLOFT) 100 MG tablet    Other Visit Diagnoses    Annual physical exam    -  Primary   Overweight (BMI 25.0-29.9)          Updated Health Maintenance information Reviewed recent lab results with patient Encouraged improvement to lifestyle with diet and exercise - Goal of weight loss  #Hyperlipidemia Encourage lifestyle diet exercise Check yearly  #Depression / Anxiety Stable to maintained control DC Gabapentin for anxiety now Continue Sertraline  daily for mood/anxiety - adjust in future if indicated   Meds ordered this encounter  Medications  . sertraline (ZOLOFT) 100 MG tablet    Sig: Take 1 tablet (100 mg total) by mouth daily.    Dispense:  90 tablet    Refill:  3  . montelukast (SINGULAIR) 10 MG tablet    Sig: Take 1 tablet (10 mg total) by mouth at bedtime.    Dispense:  90 tablet    Refill:  3    Follow up plan: Return in about 1 year (around 03/19/2020) for annual physical.  Saralyn Pilar, DO [facility] 03/20/2019, 9:08 AM

## 2019-03-20 NOTE — Patient Instructions (Signed)
Thank you for coming to the office today.  Keep up the great work overall with diet, eventually add back exercise as discussed.  Only abnormal result slightly elevated LDL cholesterol.  Discontinue Gabapentin (Neurontin)  Sertraline / Zoloft will control both mood and anxiety - if we need to adjust dose we can consider that in the future.   DUE for FASTING BLOOD WORK (no food or drink after midnight before the lab appointment, only water or coffee without cream/sugar on the morning of)  SCHEDULE "Lab Only" visit in the morning at the clinic for lab draw in 1 YEAR  - Make sure Lab Only appointment is at about 1 week before your next appointment, so that results will be available  For Lab Results, once available within 2-3 days of blood draw, you can can log in to MyChart online to view your results and a brief explanation. Also, we can discuss results at next follow-up visit.   Please schedule a Follow-up Appointment to: No follow-ups on file.  If you have any other questions or concerns, please feel free to call the office or send a message through [facility]. You may also schedule an earlier appointment if necessary.  Additionally, you may be receiving a survey about your experience at our office within a few days to 1 week by e-mail or mail. We value your feedback.  Nobie Putnam, DO [facility]

## 2019-08-17 DIAGNOSIS — Z23 Encounter for immunization: Secondary | ICD-10-CM | POA: Diagnosis not present

## 2019-09-07 DIAGNOSIS — Z23 Encounter for immunization: Secondary | ICD-10-CM | POA: Diagnosis not present

## 2019-12-12 ENCOUNTER — Emergency Department
Admission: EM | Admit: 2019-12-12 | Discharge: 2019-12-13 | Disposition: A | Discharge status: Other Acute Inpatient Hospital | Payer: BC Managed Care – PPO | Source: Home / Self Care | Attending: Emergency Medicine | Admitting: Emergency Medicine

## 2019-12-12 ENCOUNTER — Encounter: Payer: Self-pay | Admitting: *Deleted

## 2019-12-12 ENCOUNTER — Other Ambulatory Visit: Payer: Self-pay

## 2019-12-12 DIAGNOSIS — F10929 Alcohol use, unspecified with intoxication, unspecified: Secondary | ICD-10-CM

## 2019-12-12 DIAGNOSIS — R45851 Suicidal ideations: Secondary | ICD-10-CM

## 2019-12-12 DIAGNOSIS — F319 Bipolar disorder, unspecified: Secondary | ICD-10-CM | POA: Diagnosis not present

## 2019-12-12 DIAGNOSIS — Z03818 Encounter for observation for suspected exposure to other biological agents ruled out: Secondary | ICD-10-CM | POA: Diagnosis not present

## 2019-12-12 DIAGNOSIS — F4321 Adjustment disorder with depressed mood: Secondary | ICD-10-CM | POA: Diagnosis not present

## 2019-12-12 DIAGNOSIS — F4310 Post-traumatic stress disorder, unspecified: Secondary | ICD-10-CM | POA: Diagnosis not present

## 2019-12-12 DIAGNOSIS — F10229 Alcohol dependence with intoxication, unspecified: Secondary | ICD-10-CM | POA: Diagnosis not present

## 2019-12-12 DIAGNOSIS — F10129 Alcohol abuse with intoxication, unspecified: Secondary | ICD-10-CM | POA: Insufficient documentation

## 2019-12-12 DIAGNOSIS — Z20822 Contact with and (suspected) exposure to covid-19: Secondary | ICD-10-CM | POA: Insufficient documentation

## 2019-12-12 DIAGNOSIS — F419 Anxiety disorder, unspecified: Secondary | ICD-10-CM | POA: Diagnosis present

## 2019-12-12 DIAGNOSIS — F329 Major depressive disorder, single episode, unspecified: Secondary | ICD-10-CM | POA: Insufficient documentation

## 2019-12-12 DIAGNOSIS — Y907 Blood alcohol level of 200-239 mg/100 ml: Secondary | ICD-10-CM | POA: Diagnosis not present

## 2019-12-12 DIAGNOSIS — Z818 Family history of other mental and behavioral disorders: Secondary | ICD-10-CM | POA: Diagnosis not present

## 2019-12-12 DIAGNOSIS — J45909 Unspecified asthma, uncomplicated: Secondary | ICD-10-CM | POA: Insufficient documentation

## 2019-12-12 LAB — CBC
HCT: 40.4 % (ref 39.0–52.0)
Hemoglobin: 14.6 g/dL (ref 13.0–17.0)
MCH: 32.9 pg (ref 26.0–34.0)
MCHC: 36.1 g/dL — ABNORMAL HIGH (ref 30.0–36.0)
MCV: 91 fL (ref 80.0–100.0)
Platelets: 199 10*3/uL (ref 150–400)
RBC: 4.44 MIL/uL (ref 4.22–5.81)
RDW: 12.8 % (ref 11.5–15.5)
WBC: 4.5 10*3/uL (ref 4.0–10.5)
nRBC: 0 % (ref 0.0–0.2)

## 2019-12-12 MED ORDER — DIPHENHYDRAMINE HCL 12.5 MG/5ML PO ELIX
50.0000 mg | ORAL_SOLUTION | Freq: Once | ORAL | Status: AC
  Administered 2019-12-12: 50 mg via ORAL
  Filled 2019-12-12: qty 20

## 2019-12-12 MED ORDER — HALOPERIDOL 5 MG PO TABS
5.0000 mg | ORAL_TABLET | Freq: Once | ORAL | Status: AC
  Administered 2019-12-12: 5 mg via ORAL
  Filled 2019-12-12: qty 1

## 2019-12-12 NOTE — ED Triage Notes (Signed)
Pt to triage intoxicated and tearful. Pt confirmed that "if given the chance I would kill myself." pt very tearful and yelling. "I don't want to bother people by being alive."

## 2019-12-12 NOTE — ED Provider Notes (Signed)
[facility] Emergency Department Provider Note  ____________________________________________   First MD Initiated Contact with Patient 12/12/19 2350     (approximate)  I have reviewed the triage vital signs and the nursing notes.   HISTORY  Chief Complaint Suicidal    HPI Joshua Mort. is a 24 y.o. male with below list of previous medical conditions including previous suicide attempt via helium and self-inflicted lacerations presents to the emergency department with suicidal ideation at present.  Patient states that he drank an entire bottle of moonshine and then went to work and was informed that he needed to go home.  Patient repetitively stating that he wants to die".        Past Medical History:  Diagnosis Date  . Anxiety   . Asthma     Patient Active Problem List   Diagnosis Date Noted  . Mixed hyperlipidemia 03/13/2018  . Shift work sleep disorder 09/15/2017  . Low serum alkaline phosphatase 03/15/2017  . Asthma 12/01/2016  . Anxiety 12/01/2016  . Major depression in partial remission (HCC) 12/01/2016  . Environmental and seasonal allergies 12/01/2016  . Cat allergy due to both airborne and skin contact 12/01/2016    Past Surgical History:  Procedure Laterality Date  . POLYPECTOMY    . TONSILLECTOMY    . WISDOM TOOTH EXTRACTION      Prior to Admission medications   Medication Sig Start Date End Date Taking? Authorizing Provider  albuterol (PROVENTIL HFA;VENTOLIN HFA) 108 (90 Base) MCG/ACT inhaler Inhale 2 puffs into the lungs every 4 (four) hours as needed for wheezing or shortness of breath (cough). 03/03/17   Karamalegos, Netta Neat, DO  cetirizine (ZYRTEC) 10 MG tablet Take 1 tablet (10 mg total) by mouth daily. 12/01/16   Karamalegos, Netta Neat, DO  fluticasone (FLONASE) 50 MCG/ACT nasal spray Place 2 sprays into both nostrils daily.    [provider]  montelukast (SINGULAIR) 10 MG tablet Take 1 tablet (10  mg total) by mouth at bedtime. 03/20/19   Karamalegos, Netta Neat, DO  sertraline (ZOLOFT) 100 MG tablet Take 1 tablet (100 mg total) by mouth daily. 03/20/19   Smitty Cords, DO    Allergies Patient has no known allergies.  Family History  Problem Relation Age of Onset  . Depression Mother   . Depression Father   . Mental illness Maternal Grandfather     Social History Social History   Tobacco Use  . Smoking status: Never Smoker  . Smokeless tobacco: Never Used  Vaping Use  . Vaping Use: Never used  Substance Use Topics  . Alcohol use: Yes  . Drug use: No    Review of Systems Constitutional: No fever/chills Eyes: No visual changes. ENT: No sore throat. Cardiovascular: Denies chest pain. Respiratory: Denies shortness of breath. Gastrointestinal: No abdominal pain.  No nausea, no vomiting.  No diarrhea.  No constipation. Genitourinary: Negative for dysuria. Musculoskeletal: Negative for neck pain.  Negative for back pain. Integumentary: Negative for rash. Neurological: Negative for headaches, focal weakness or numbness. Psychiatric:  Positive for alcohol intoxication and suicidal ideation   ____________________________________________   PHYSICAL EXAM:  VITAL SIGNS: ED Triage Vitals  Enc Vitals Group     BP 12/12/19 2329 (!) 138/97     Pulse Rate 12/12/19 2329 (!) 110     Resp 12/12/19 2329 16     Temp 12/12/19 2329 98.7 F (37.1 C)     Temp Source 12/12/19 2329 Oral  SpO2 12/12/19 2329 100 %     Weight 12/12/19 2332 95.3 kg (210 lb 1.6 oz)     Height 12/12/19 2332 1.803 m (5\' 11" )     Head Circumference --      Peak Flow --      Pain Score 12/12/19 2332 0     Pain Loc --      Pain Edu? --      Excl. in GC? --     Constitutional: Alert and oriented.  Appears intoxicated, tearful Eyes: Conjunctivae are normal.  Head: Atraumatic. Mouth/Throat: Patient is wearing a mask. Neck: No stridor.  No meningeal signs.   Cardiovascular: Normal  rate, regular rhythm. Good peripheral circulation. Grossly normal heart sounds. Respiratory: Normal respiratory effort.  No retractions. Gastrointestinal: Soft and nontender. No distention.  Musculoskeletal: No lower extremity tenderness nor edema. No gross deformities of extremities. Neurologic:  Normal speech and language. No gross focal neurologic deficits are appreciated.  Skin:  Skin is warm, dry and intact. Psychiatric: Appears intoxicated, slurring of speech, stated suicidal ideation  ____________________________________________   LABS (all labs ordered are listed, but only abnormal results are displayed)  Labs Reviewed  CBC - Abnormal; Notable for the following components:      Result Value   MCHC 36.1 (*)    All other components within normal limits  COMPREHENSIVE METABOLIC PANEL  ETHANOL  SALICYLATE LEVEL  ACETAMINOPHEN LEVEL  URINE DRUG SCREEN, QUALITATIVE ([facility] ONLY)     Procedures   ____________________________________________   INITIAL IMPRESSION / MDM / ASSESSMENT AND PLAN / ED COURSE  As part of my medical decision making, I reviewed the following data within the electronic MEDICAL RECORD NUMBER  The patient has been placed in psychiatric observation due to the need to provide a safe environment for the patient while obtaining psychiatric consultation and evaluation, as well as ongoing medical and medication management to treat the patient's condition.  The patient has been placed under full IVC at this time.  Awaiting psychiatry consultation and disposition.   ____________________________________________  FINAL CLINICAL IMPRESSION(S) / ED DIAGNOSES  Final diagnoses:  Suicidal ideation  Alcoholic intoxication with complication (HCC)     MEDICATIONS GIVEN DURING THIS VISIT:  Medications  haloperidol (HALDOL) tablet 5 mg (has no administration in time range)  diphenhydrAMINE (BENADRYL) 12.5 MG/5ML elixir 50 mg (has no administration in time range)      ED Discharge Orders    None      *Please note:  Joshua Celani. was evaluated in Emergency Department on 12/12/2019 for the symptoms described in the history of present illness. He was evaluated in the context of the global COVID-19 pandemic, which necessitated consideration that the patient might be at risk for infection with the SARS-CoV-2 virus that causes COVID-19. Institutional protocols and algorithms that pertain to the evaluation of patients at risk for COVID-19 are in a state of rapid change based on information released by regulatory bodies including the CDC and federal and state organizations. These policies and algorithms were followed during the patient's care in the ED.  Some ED evaluations and interventions may be delayed as a result of limited staffing during and after the pandemic.*  Note:  This document was prepared using Dragon voice recognition software and may include unintentional dictation errors.   12/14/2019, MD 12/13/19 [phone]

## 2019-12-13 ENCOUNTER — Inpatient Hospital Stay
Admission: EM | Admit: 2019-12-13 | Discharge: 2019-12-15 | DRG: 885 | Disposition: A | Discharge status: Home / Self Care | Payer: BC Managed Care – PPO | Source: Intra-hospital | Attending: Psychiatry | Admitting: Psychiatry

## 2019-12-13 DIAGNOSIS — F4321 Adjustment disorder with depressed mood: Secondary | ICD-10-CM | POA: Diagnosis present

## 2019-12-13 DIAGNOSIS — R45851 Suicidal ideations: Secondary | ICD-10-CM | POA: Diagnosis present

## 2019-12-13 DIAGNOSIS — Y907 Blood alcohol level of 200-239 mg/100 ml: Secondary | ICD-10-CM | POA: Diagnosis present

## 2019-12-13 DIAGNOSIS — Z20822 Contact with and (suspected) exposure to covid-19: Secondary | ICD-10-CM | POA: Diagnosis present

## 2019-12-13 DIAGNOSIS — F319 Bipolar disorder, unspecified: Principal | ICD-10-CM | POA: Diagnosis present

## 2019-12-13 DIAGNOSIS — Z818 Family history of other mental and behavioral disorders: Secondary | ICD-10-CM

## 2019-12-13 DIAGNOSIS — F332 Major depressive disorder, recurrent severe without psychotic features: Secondary | ICD-10-CM | POA: Diagnosis present

## 2019-12-13 DIAGNOSIS — F3341 Major depressive disorder, recurrent, in partial remission: Secondary | ICD-10-CM | POA: Diagnosis present

## 2019-12-13 DIAGNOSIS — F10229 Alcohol dependence with intoxication, unspecified: Secondary | ICD-10-CM | POA: Diagnosis present

## 2019-12-13 DIAGNOSIS — R9431 Abnormal electrocardiogram [ECG] [EKG]: Secondary | ICD-10-CM | POA: Diagnosis not present

## 2019-12-13 DIAGNOSIS — F4310 Post-traumatic stress disorder, unspecified: Secondary | ICD-10-CM | POA: Diagnosis present

## 2019-12-13 DIAGNOSIS — F331 Major depressive disorder, recurrent, moderate: Secondary | ICD-10-CM | POA: Diagnosis present

## 2019-12-13 LAB — COMPREHENSIVE METABOLIC PANEL
ALT: 35 U/L (ref 0–44)
AST: 30 U/L (ref 15–41)
Albumin: 5 g/dL (ref 3.5–5.0)
Alkaline Phosphatase: 49 U/L (ref 38–126)
Anion gap: 11 (ref 5–15)
BUN: 16 mg/dL (ref 6–20)
CO2: 24 mmol/L (ref 22–32)
Calcium: 8.9 mg/dL (ref 8.9–10.3)
Chloride: 108 mmol/L (ref 98–111)
Creatinine, Ser: 1.07 mg/dL (ref 0.61–1.24)
GFR calc Af Amer: >60 mL/min (ref >60)
GFR calc non Af Amer: >60 mL/min (ref >60)
Glucose, Bld: 127 mg/dL — ABNORMAL HIGH (ref 70–99)
Potassium: 3.8 mmol/L (ref 3.5–5.1)
Sodium: 143 mmol/L (ref 135–145)
Total Bilirubin: 0.8 mg/dL (ref 0.3–1.2)
Total Protein: 7.3 g/dL (ref 6.5–8.1)

## 2019-12-13 LAB — ETHANOL: Alcohol, Ethyl (B): 212 mg/dL — ABNORMAL HIGH (ref <10)

## 2019-12-13 LAB — ACETAMINOPHEN LEVEL: Acetaminophen (Tylenol), Serum: <10 ug/mL — ABNORMAL LOW (ref 10–30)

## 2019-12-13 LAB — SALICYLATE LEVEL: Salicylate Lvl: <7 mg/dL — ABNORMAL LOW (ref 7.0–30.0)

## 2019-12-13 LAB — SARS CORONAVIRUS 2 BY RT PCR (HOSPITAL ORDER, PERFORMED IN ~~LOC~~ HOSPITAL LAB): SARS Coronavirus 2: NEGATIVE

## 2019-12-13 MED ORDER — SERTRALINE HCL 100 MG PO TABS
100.0000 mg | ORAL_TABLET | Freq: Every day | ORAL | Status: DC
  Administered 2019-12-13: 100 mg via ORAL
  Filled 2019-12-13: qty 1

## 2019-12-13 MED ORDER — HYDROXYZINE HCL 25 MG PO TABS
25.0000 mg | ORAL_TABLET | Freq: Four times a day (QID) | ORAL | Status: DC | PRN
  Administered 2019-12-14: 25 mg via ORAL
  Filled 2019-12-13: qty 1

## 2019-12-13 MED ORDER — ZIPRASIDONE HCL 20 MG PO CAPS
20.0000 mg | ORAL_CAPSULE | Freq: Every day | ORAL | Status: DC
  Administered 2019-12-14: 20 mg via ORAL
  Filled 2019-12-13: qty 1

## 2019-12-13 MED ORDER — SERTRALINE HCL 25 MG PO TABS
150.0000 mg | ORAL_TABLET | Freq: Every day | ORAL | Status: DC
  Administered 2019-12-14 – 2019-12-15 (×2): 150 mg via ORAL
  Filled 2019-12-13 (×2): qty 2

## 2019-12-13 MED ORDER — TRAZODONE HCL 50 MG PO TABS: 50.0000 mg | ORAL_TABLET | Freq: Every evening | ORAL | Status: DC | PRN

## 2019-12-13 MED ORDER — ACETAMINOPHEN 325 MG PO TABS: 650.0000 mg | ORAL_TABLET | Freq: Four times a day (QID) | ORAL | Status: DC | PRN

## 2019-12-13 MED ORDER — MAGNESIUM HYDROXIDE 400 MG/5ML PO SUSP: 30.0000 mL | Freq: Every day | ORAL | Status: DC | PRN

## 2019-12-13 MED ORDER — ALUM & MAG HYDROXIDE-SIMETH 200-200-20 MG/5ML PO SUSP: 30.0000 mL | ORAL | Status: DC | PRN

## 2019-12-13 MED ORDER — MIRTAZAPINE 15 MG PO TBDP
15.0000 mg | ORAL_TABLET | Freq: Every day | ORAL | Status: DC
  Administered 2019-12-14: 15 mg via ORAL
  Filled 2019-12-13 (×3): qty 1

## 2019-12-13 NOTE — Plan of Care (Signed)
D: Pt alert and oriented x 4. Pt denies experiencing any anxiety/depression at this time. Pt denies experiencing any pain at this time. Pt denies experiencing any SI/HI, or AVH at this time.   When pt is asked what brought him here pt replies depression. When asked what is causing his depression pt replies this world is an awful place and why would anyone want to live in it.   A: Scheduled medications administered to pt, per MD orders. Support and encouragement provided. Frequent verbal contact made. Routine safety checks conducted q15 minutes.   R: No adverse drug reactions noted. Pt verbally contracts for safety at this time. Pt complaint with medications and treatment plan. Pt interacts well with others on the unit. Pt remains safe at this time. Will continue to monitor.   Problem: Education: Goal: Knowledge of [facility] Education information/materials will improve Outcome: Not Progressing Goal: Emotional status will improve Outcome: Not Progressing Goal: Mental status will improve Outcome: Not Progressing Goal: Verbalization of understanding the information provided will improve Outcome: Not Progressing   Problem: Safety: Goal: Periods of time without injury will increase Outcome: Not Progressing   Problem: Education: Goal: Ability to make informed decisions regarding treatment will improve Outcome: Not Progressing   Problem: Medication: Goal: Compliance with prescribed medication regimen will improve Outcome: Not Progressing

## 2019-12-13 NOTE — Consult Note (Signed)
[facility] Face-to-Face Psychiatry Consult   Reason for Consult: Suicidal Referring Physician: Dr. Manson Passey Patient Identification: Joshua Edwards. MRN:  [id number] Principal Diagnosis: <principal problem not specified> Diagnosis:  Active Problems:   Anxiety   Major depression in partial remission (HCC)   Total Time spent with patient: 30 minutes  Subjective: " I drank too much tonight." Joshua Edwards Marshal Eskew. is a 24 y.o. male patient presented to [facility] ED via POV under involuntary commitment status (IVC). Per the ED triage nurse not, the patient is to triage intoxicated and tearful. The patient confirmed that "if given a chance, I would kill myself." The patient is very tearful and yelling. "I don't want to bother people by being alive." The patient's BAL is 212 mg/dl due to him voicing that " I drink too much sometimes." The patient was seen face-to-face by this provider; the chart was reviewed and consulted with Dr. Manson Passey on 12/13/2019 due to the patient's care. It was discussed with the EDP that the patient does meet the criteria to be admitted to the psychiatric inpatient unit.  On evaluation, the patient is alert and oriented x 2, calm and cooperative, and mood-congruent with affect. The patient does not appear to be responding to internal or external stimuli. Neither is the patient presenting with any delusional thinking. The patient denies auditory or visual hallucinations. The patient admits to suicidal ideation and has a history of two suicide attempts. The patient denies homicidal and self-harm ideations. The patient is presenting with psychotic behavior. During an encounter with the patient, he was able to answer questions appropriately.    Plan: The patient is a safety risk to self and requires psychiatric inpatient admission for stabilization and treatment.  HPI: Per Dr. Manson Passey: Joshua Edwards. is a 24 y.o. male with below list of previous medical conditions including previous  suicide attempt via helium and self-inflicted lacerations presents to the emergency department with suicidal ideation at present.  Patient states that he drank an entire bottle of moonshine and then went to work and was informed that he needed to go home.  Patient repetitively stating that he wants to die".  Past Psychiatric History:  Anxiety  Risk to Self: Suicidal Ideation: No-Not Currently/Within Last 6 Months Suicidal Intent: No-Not Currently/Within Last 6 Months Is patient at risk for suicide?: Yes Suicidal Plan?: No-Not Currently/Within Last 6 Months Access to Means:  (Patient has access to roads) What has been your use of drugs/alcohol within the last 12 months?: Alcohol How many times?: 2 Other Self Harm Risks: None Triggers for Past Attempts: None known Intentional Self Injurious Behavior: None Risk to Others: Homicidal Ideation: No Thoughts of Harm to Others: No Current Homicidal Intent: No Current Homicidal Plan: No Access to Homicidal Means: No Identified Victim: None History of harm to others?: No Assessment of Violence: None Noted Violent Behavior Description: None Does patient have access to weapons?: No Criminal Charges Pending?: No Does patient have a court date: No Prior Inpatient Therapy: Prior Inpatient Therapy: Yes Prior Therapy Dates: Unknown Prior Therapy Facilty/Provider(s): Unknown Reason for Treatment: Suicide Attempt Prior Outpatient Therapy: Prior Outpatient Therapy: Yes Prior Therapy Dates: Unknown Prior Therapy Facilty/Provider(s): Unknown Reason for Treatment: Depression, Anxiety Does patient have an ACCT team?: No Does patient have Intensive In-House Services?  : No Does patient have [facility] services? : No Does patient have P4CC services?: No  Past Medical History:  Past Medical History:  Diagnosis Date  . Anxiety   . Asthma  Past Surgical History:  Procedure Laterality Date  . POLYPECTOMY    . TONSILLECTOMY    . WISDOM TOOTH  EXTRACTION     Family History:  Family History  Problem Relation Age of Onset  . Depression Mother   . Depression Father   . Mental illness Maternal Grandfather    Family Psychiatric  History:  Social History:  Social History   Substance and Sexual Activity  Alcohol Use Yes     Social History   Substance and Sexual Activity  Drug Use No    Social History   Socioeconomic History  . Marital status: Single    Spouse name: Not on file  . Number of children: Not on file  . Years of education: Not on file  . Highest education level: Not on file  Occupational History  . Occupation: Pending interview  Tobacco Use  . Smoking status: Never Smoker  . Smokeless tobacco: Never Used  Vaping Use  . Vaping Use: Never used  Substance and Sexual Activity  . Alcohol use: Yes  . Drug use: No  . Sexual activity: Never  Other Topics Concern  . Not on file  Social History Narrative  . Not on file   Social Determinants of Health   Financial Resource Strain:   . Difficulty of Paying Living Expenses:   Food Insecurity:   . Worried About Programme researcher, broadcasting/film/video in the Last Year:   . Barista in the Last Year:   Transportation Needs:   . Freight forwarder (Medical):   Marland Kitchen Lack of Transportation (Non-Medical):   Physical Activity:   . Days of Exercise per Week:   . Minutes of Exercise per Session:   Stress:   . Feeling of Stress :   Social Connections:   . Frequency of Communication with Friends and Family:   . Frequency of Social Gatherings with Friends and Family:   . Attends Religious Services:   . Active Member of Clubs or Organizations:   . Attends Banker Meetings:   Marland Kitchen Marital Status:    Additional Social History:    Allergies:  No Known Allergies  Labs:  Results for orders placed or performed during the hospital encounter of 12/12/19 (from the past 48 hour(s))  Comprehensive metabolic panel     Status: Abnormal   Collection Time: 12/12/19 11:35  PM  Result Value Ref Range   Sodium 143 135 - 145 mmol/L   Potassium 3.8 3.5 - 5.1 mmol/L    Comment: HEMOLYSIS AT THIS LEVEL MAY AFFECT RESULT   Chloride 108 98 - 111 mmol/L   CO2 24 22 - 32 mmol/L   Glucose, Bld 127 (H) 70 - 99 mg/dL    Comment: Glucose reference range applies only to samples taken after fasting for at least 8 hours.   BUN 16 6 - 20 mg/dL   Creatinine, Ser 3.20 0.61 - 1.24 mg/dL   Calcium 8.9 8.9 - 03.7 mg/dL   Total Protein 7.3 6.5 - 8.1 g/dL   Albumin 5.0 3.5 - 5.0 g/dL   AST 30 15 - 41 U/L   ALT 35 0 - 44 U/L   Alkaline Phosphatase 49 38 - 126 U/L   Total Bilirubin 0.8 0.3 - 1.2 mg/dL   GFR calc non Af Amer >60 >60 mL/min   GFR calc Af Amer >60 >60 mL/min   Anion gap 11 5 - 15    Comment: Performed at [facility], [address] [place], [place] [address]  Ethanol     Status: Abnormal   Collection Time: 12/12/19 11:35 PM  Result Value Ref Range   Alcohol, Ethyl (B) 212 (H) <10 mg/dL    Comment: (NOTE) Lowest detectable limit for serum alcohol is 10 mg/dL.  For medical purposes only. Performed at [facility], [address] [place], [place] [address]   Salicylate level     Status: Abnormal   Collection Time: 12/12/19 11:35 PM  Result Value Ref Range   Salicylate Lvl <7.0 (L) 7.0 - 30.0 mg/dL    Comment: Performed at [facility], [address] [place], [place] [address]  Acetaminophen level     Status: Abnormal   Collection Time: 12/12/19 11:35 PM  Result Value Ref Range   Acetaminophen (Tylenol), Serum <10 (L) 10 - 30 ug/mL    Comment: (NOTE) Therapeutic concentrations vary significantly. A range of 10-30 ug/mL  may be an effective concentration for many patients. However, some  are best treated at concentrations outside of this range. Acetaminophen concentrations >150 ug/mL at 4 hours after ingestion  and >50 ug/mL at 12 hours after ingestion are often associated with  toxic reactions.  Performed at [facility], [address] [place], [place] [address]   cbc     Status: Abnormal   Collection Time: 12/12/19 11:35 PM  Result Value Ref Range   WBC 4.5 4.0 - 10.5 K/uL   RBC 4.44 4.22 - 5.81 MIL/uL   Hemoglobin 14.6 13.0 - 17.0 g/dL   HCT 29.540.4 39 - 52 %   MCV 91.0 80.0 - 100.0 fL   MCH 32.9 26.0 - 34.0 pg   MCHC 36.1 (H) 30.0 - 36.0 g/dL   RDW 62.112.8 30.811.5 - 65.715.5 %   Platelets 199 150 - 400 K/uL   nRBC 0.0 0.0 - 0.2 %    Comment: Performed at [facility], [address] [place], [place] [address]    No current facility-administered medications for this encounter.   Current Outpatient Medications  Medication Sig Dispense Refill  . albuterol (PROVENTIL HFA;VENTOLIN HFA) 108 (90 Base) MCG/ACT inhaler Inhale 2 puffs into the lungs every 4 (four) hours as needed for wheezing or shortness of breath (cough). 1 Inhaler 2  . cetirizine (ZYRTEC) 10 MG tablet Take 1 tablet (10 mg total) by mouth daily. 30 tablet 11  . fluticasone (FLONASE) 50 MCG/ACT nasal spray Place 2 sprays into both nostrils daily.    . montelukast (SINGULAIR) 10 MG tablet Take 1 tablet (10 mg total) by mouth at bedtime. 90 tablet 3  . sertraline (ZOLOFT) 100 MG tablet Take 1 tablet (100 mg total) by mouth daily. 90 tablet 3    Musculoskeletal: Strength & Muscle Tone: within normal limits Gait & Station: normal Patient leans: N/A  Psychiatric Specialty Exam: Physical Exam Psychiatric:        Attention and Perception: Attention normal.        Mood and Affect: Mood is depressed. Affect is blunt, flat and inappropriate.        Speech: Speech is slurred.        Behavior: Behavior is slowed. Behavior is cooperative.        Thought Content: Thought content includes suicidal ideation.        Judgment: Judgment is impulsive.     Review of Systems  Psychiatric/Behavioral: Positive for agitation, self-injury and suicidal ideas. The patient is nervous/anxious.   All other systems reviewed and are  negative.   Blood pressure (!) 138/97, pulse (!) 110, temperature 98.7 F (37.1 C), temperature source Oral, resp. rate 16, height 5\' 11"  (1.803 m), weight 95.3 kg, SpO2 100 %.Body mass index is 29.3 kg/m.  General Appearance: Casual  Eye Contact:  Poor  Speech:  Garbled and Slurred  Volume:  Decreased  Mood:  Depressed  Affect:  Congruent  Thought Process:  Disorganized and Irrelevant  Orientation:  Full (Time, Place, and Person)  Thought Content:  WDL and Rumination  Suicidal Thoughts:  Yes.  without intent/plan  Homicidal Thoughts:  No  Memory:  Immediate;   Fair Recent;   Fair Remote;   Fair  Judgement:  Impaired  Insight:  Lacking  Psychomotor Activity:  Normal  Concentration:  Concentration: Poor and Attention Span: Poor  Recall:  Poor  Fund of Knowledge:  Poor  Language:  Poor  Akathisia:  NA  Handed:  Right  AIMS (if indicated):     Assets:  Communication Skills Desire for Improvement Social Support  ADL's:  Intact  Cognition:  WNL  Sleep:        Treatment Plan Summary: Medication management and Plan Patient meets criteria for psychiatric inpatient admission.  Disposition: Recommend psychiatric Inpatient admission when medically cleared. Supportive therapy provided about ongoing stressors.  , NP 12/13/2019 2:39 AM

## 2019-12-13 NOTE — [facility] Suicide Risk Assessment (Signed)
[facility] INPATIENT:  Family/Significant Other Suicide Prevention Education  Suicide Prevention Education:  Patient Refusal for Family/Significant Other Suicide Prevention Education: The patient Joshua Edwards. has refused to provide written consent for family/significant other to be provided Family/Significant Other Suicide Prevention Education during admission and/or prior to discharge.  Physician notified.  SPE completed with pt, as pt refused to consent to family contact. SPI pamphlet provided to pt and pt was encouraged to share information with support network, ask questions, and talk about any concerns relating to SPE. Pt denies access to guns/firearms and verbalized understanding of information provided. Mobile Crisis information also provided to pt.   Harden Mo 12/13/2019, 11:46 AM

## 2019-12-13 NOTE — Tx Team (Signed)
Initial Treatment Plan 12/13/2019 4:29 AM [name] MAU:[id number]    PATIENT STRESSORS: Medication change or noncompliance Substance abuse   PATIENT STRENGTHS: Ability for insight Motivation for treatment/growth   PATIENT IDENTIFIED PROBLEMS: Suicidal Ideation  Depression  Anxiety                  DISCHARGE CRITERIA:  Verbal commitment to aftercare and medication compliance  PRELIMINARY DISCHARGE PLAN: Outpatient therapy Return to previous living arrangement  PATIENT/FAMILY INVOLVEMENT: This treatment plan has been presented to and reviewed with the patient, Joshua Edwards.. The patient has been given the opportunity to ask questions and make suggestions.  Elmyra Ricks, RN 12/13/2019, 4:29 AM

## 2019-12-13 NOTE — H&P (Signed)
Psychiatric Admission Assessment Adult  Patient Identification: Joshua Edwards. MRN:  [id number] Date of Evaluation:  12/13/2019 Chief Complaint:  MDD (major depressive disorder), recurrent episode, severe (HCC) [F33.2] Principal Diagnosis: <principal problem not specified> Diagnosis:  Active Problems:   MDD (major depressive disorder), recurrent episode, severe (HCC)  Adjustment disorder mixed emotions and conduct  ETOH dependence, intoxication, --  Parent child issues      History of Present Illness:  Patient on IVC post intoxication and had suicidal statements.  Parents /were concerned --and brought him here.    He has ongoing ETOH since age 77 and self medicates ---has not dealt with his issues and describes overbearing set of one parents vs. The other.   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  No ETOH before age 14   Depression --depressed mood crying spells, hopeless helpless feelings ---lack of energy motivation, concentration, sadness lack of sleep weight gain, anhedonia ---passive SI and plans   Anxiety --excessive worry, nervousness, tension, frustration, somatic and anxious concerns, autonomic ---symptoms   No psychosis   Has mood swings, ups and downs, highs and lows, irritability, edginess frustration,    NO other drugs --substances    Past Psychiatric History:   One admission in three years ago  ----for one week in [place] similar ---  Substance drug and ETOH - -ETOH only --increasing /weekend binging lately  Longest sobriety ---  Smoking and MJ   None  Court and Legal issues None  Developmental Delays  One  Education ---finished two years --   Is the patient at risk to self? Yes.    Has the patient been a risk to self in the past 6 months? Yes.    Has the patient been a risk to self within the distant past? Yes.    Is the patient a risk to others? No.  Has the patient been a risk to others in the past 6 months? No.  Has the patient been a risk to others within the distant past? No.   Prior Inpatient Therapy:  one  Prior Outpatient Therapy:   ----none   Currently --on Zoloft 50 mg ---    Alcohol Screening: 1. How often do you have a drink containing alcohol?: 2 to 4 times a month 2. How many drinks containing alcohol do you have on a typical day when you are drinking?: 5 or 6 3. How often do you have six or more drinks on one occasion?: Monthly AUDIT-C Score: 6 4. How often during the last year have you found that you were not able to stop drinking once you had started?: Never 5. How often during the last year have you failed to do what was normally expected from you because of drinking?: Never 6. How often during the last year have you needed a first drink in the morning to get yourself going after a heavy drinking session?:  Never 7. How often during the last year have you had a feeling of guilt of remorse after drinking?: Never 8. How often during the last year have you been unable to remember what happened the night before because you had been drinking?: Never 9. Have you or someone else been injured as a result of your drinking?: No 10. Has a relative or friend or a doctor or another health worker been concerned about your drinking or suggested you cut down?: No Alcohol Use Disorder Identification Test Final Score (AUDIT): 6 Alcohol Brief Interventions/Follow-up:  AUDIT Score <7 follow-up not indicated Substance Abuse History in the last 12 months:  No. Consequences of Substance Abuse: His own work and family interference Previous Psychotropic Medications:  Yes  Psychological Evaluations:  Previous hospitalizations   Past Medical History:  Past Medical History:  Diagnosis Date  . Anxiety   . Asthma     Past Surgical History:  Procedure Laterality Date  . POLYPECTOMY    . TONSILLECTOMY    . WISDOM TOOTH EXTRACTION     Family History:  Family History  Problem Relation Age of Onset  . Depression Mother   . Depression Father   . Mental illness Maternal Grandfather    Family Psychiatric  History:   Very complex--with both parents having strong addictions, major depression /his paternal grand mom with C/S paternal relatives with ETOH ism and bipolar and depression  Bio mom with depression and anxiety     Tobacco Screening: Have you used any form of tobacco in the last 30 days? (Cigarettes, Smokeless Tobacco, Cigars, and/or Pipes): No Social History:  Social History   Substance and Sexual Activity  Alcohol Use Yes     Social History   Substance and Sexual Activity  Drug Use No    Additional Social History: Marital status: Single Are you sexually active?: No What is your sexual orientation?: "Females I suppose" Has your sexual activity been affected by drugs, alcohol, medication, or emotional stress?: "Think so. I don't know.  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  In conflict with other side's religion-                  Allergies:  No Known Allergies Lab Results:  Results for orders placed or performed during the hospital encounter of 12/12/19 (from the past 48 hour(s))  Comprehensive metabolic panel     Status: Abnormal   Collection Time: 12/12/19 11:35 PM  Result Value Ref Range   Sodium  143 135 - 145 mmol/L   Potassium 3.8 3.5 - 5.1 mmol/L    Comment: HEMOLYSIS AT THIS LEVEL MAY AFFECT RESULT   Chloride 108 98 - 111 mmol/L   CO2 24 22 - 32 mmol/L   Glucose, Bld 127 (H) 70 - 99 mg/dL    Comment: Glucose reference range applies only to samples taken after fasting for at least 8 hours.   BUN 16 6 - 20 mg/dL   Creatinine, Ser 9.56 0.61 - 1.24 mg/dL   Calcium 8.9 8.9 - 38.7 mg/dL   Total Protein 7.3 6.5 - 8.1 g/dL   Albumin 5.0 3.5 - 5.0 g/dL   AST 30 15 - 41 U/L   ALT 35 0 - 44 U/L   Alkaline Phosphatase 49 38 - 126 U/L   Total Bilirubin 0.8 0.3 -  1.2 mg/dL   GFR calc non Af Amer >60 >60 mL/min   GFR calc Af Amer >60 >60 mL/min   Anion gap 11 5 - 15    Comment: Performed at [facility], [address] [place], [place] [address]  Ethanol     Status: Abnormal   Collection Time: 12/12/19 11:35 PM  Result Value Ref Range   Alcohol, Ethyl (B) 212 (H) <10 mg/dL    Comment: (NOTE) Lowest detectable limit for serum alcohol is 10 mg/dL.  For medical purposes only. Performed at [facility], [address] [place], [place] [address]   Salicylate level     Status: Abnormal   Collection Time: 12/12/19 11:35 PM  Result Value Ref Range   Salicylate Lvl <7.0 (L) 7.0 - 30.0 mg/dL    Comment: Performed at [facility], [address] [place], [place] [address]  Acetaminophen level     Status: Abnormal   Collection Time: 12/12/19 11:35 PM  Result Value Ref Range   Acetaminophen (Tylenol), Serum <10 (L) 10 - 30 ug/mL    Comment: (NOTE) Therapeutic concentrations vary significantly. A range of 10-30 ug/mL  may be an effective concentration for many patients. However, some  are best treated at concentrations outside of this range. Acetaminophen concentrations >150 ug/mL at 4 hours after ingestion  and >50 ug/mL at 12 hours after ingestion are often associated with  toxic reactions.  Performed at [facility], [address]  [place], [place] [address]   cbc     Status: Abnormal   Collection Time: 12/12/19 11:35 PM  Result Value Ref Range   WBC 4.5 4.0 - 10.5 K/uL   RBC 4.44 4.22 - 5.81 MIL/uL   Hemoglobin 14.6 13.0 - 17.0 g/dL   HCT 53.6 39 - 52 %   MCV 91.0 80.0 - 100.0 fL   MCH 32.9 26.0 - 34.0 pg   MCHC 36.1 (H) 30.0 - 36.0 g/dL   RDW 64.4 03.4 - 74.2 %   Platelets 199 150 - 400 K/uL   nRBC 0.0 0.0 - 0.2 %    Comment: Performed at [facility], [address], [place], [place] [address]  SARS Coronavirus 2 by RT PCR (hospital order, performed in [facility] hospital lab) Nasopharyngeal Nasopharyngeal Swab     Status: None   Collection Time: 12/13/19  2:09 AM   Specimen: Nasopharyngeal Swab  Result Value Ref Range   SARS Coronavirus 2 NEGATIVE NEGATIVE    Comment: (NOTE) SARS-CoV-2 target nucleic acids are NOT DETECTED.  The SARS-CoV-2 RNA is generally detectable in upper and lower respiratory specimens during the acute phase of infection. The lowest concentration of SARS-CoV-2 viral copies this assay can detect is 250 copies / mL. A negative result does not preclude SARS-CoV-2 infection and should not be used as the sole basis for treatment or other patient management decisions.  A negative result may occur with improper specimen collection / handling, submission of specimen other than nasopharyngeal swab, presence of viral mutation(s) within the areas targeted by this assay, and inadequate number of viral copies (<250 copies / mL). A negative result must be combined with clinical observations, patient history, and epidemiological information.  Fact Sheet for Patients:   BoilerBrush.com.cy  Fact Sheet for Healthcare Providers: https://pope.com/  This test is not yet approved or  cleared by the [place] FDA and has been authorized for detection and/or diagnosis of SARS-CoV-2 by FDA under an Emergency Use Authorization (EUA).  This EUA  will remain in effect (meaning this test can be used) for the duration of the COVID-19 declaration under Section 564(b)(1) of the Act, 21 U.S.C. section 360bbb-3(b)(1), unless the authorization is terminated or revoked sooner.  Performed at [facility], [address] [place], [place] [address]     Blood Alcohol level:  Lab Results  Component Value Date   ETH 212 (H) 12/12/2019    Metabolic Disorder Labs:  Lab Results  Component Value Date   HGBA1C 4.5 03/14/2019   MPG 82 03/14/2019   MPG 85 03/10/2018   No results found for: PROLACTIN Lab Results  Component Value Date   CHOL 220 (H) 03/14/2019   TRIG 85 03/14/2019   HDL 60 03/14/2019   CHOLHDL 3.7 03/14/2019   LDLCALC 141 (H) 03/14/2019   LDLCALC 122 (H) 03/10/2018    Current Medications: Current Facility-Administered Medications  Medication Dose Route Frequency Provider Last Rate Last Admin  . acetaminophen (TYLENOL) tablet 650 mg  650 mg Oral Q6H PRN Gillermo Murdochhompson, Jacqueline, NP      . alum & mag hydroxide-simeth (MAALOX/MYLANTA) 200-200-20 MG/5ML suspension 30 mL  30 mL Oral Q4H PRN Gillermo Murdochhompson, Jacqueline, NP      . hydrOXYzine (ATARAX/VISTARIL) tablet 25 mg  25 mg Oral Q6H PRN Gillermo Murdochhompson, Jacqueline, NP      . magnesium hydroxide (MILK OF MAGNESIA) suspension 30 mL  30 mL Oral Daily PRN Gillermo Murdochhompson, Jacqueline, NP      . sertraline (ZOLOFT) tablet 100 mg  100 mg Oral Daily Gillermo Murdochhompson, Jacqueline, NP   100 mg at 12/13/19 0750  . traZODone (DESYREL) tablet 50 mg  50 mg Oral QHS PRN Gillermo Murdochhompson, Jacqueline, NP       PTA Medications: Medications Prior to Admission  Medication Sig Dispense Refill Last Dose  . albuterol (PROVENTIL HFA;VENTOLIN HFA) 108 (90 Base) MCG/ACT inhaler Inhale 2 puffs into the lungs every 4 (four) hours as needed for wheezing or shortness of breath (cough). 1 Inhaler 2   . cetirizine (ZYRTEC) 10 MG tablet Take 1 tablet (10 mg total) by mouth daily. 30 tablet 11   . fluticasone (FLONASE) 50 MCG/ACT  nasal spray Place 2 sprays into both nostrils daily.     . montelukast (SINGULAIR) 10 MG tablet Take 1 tablet (10 mg total) by mouth at bedtime. 90 tablet 3   . sertraline (ZOLOFT) 100 MG tablet Take 1 tablet (100 mg total) by mouth daily. 90 tablet 3     Musculoskeletal: Strength & Muscle Tone: normal  Gait & Station: normal  Patient leans: -  Psychiatric Specialty Exam: Physical Exam  Review of Systems  Blood pressure 122/60, pulse 75, temperature 98.7 F (37.1 C), temperature source Oral, resp. rate 17, height 5\' 11"  (1.803 m), weight 95.3 kg, SpO2 99 %.Body mass index is 29.3 kg/m.  Mental Status     Alert cooperative oriented to person place and time  Consciousness not clouded or fluctuant Concentration and attention normal Speech --normal rate tone volume fluency Mood depressed affect okay Thought process and content --depressive themes --no psychosis irritable edgy frustrated  SI and HI --no HI ---SI not determined yet\ Fund of knowledge and Intelligence --normal  Social skills --coping --mom feels he may have Asperger's Anxious to some degree Abstraction ---okay  Memory remote recent and immediate intact  Treatment Plan Summary:   Caucasian male with history of major depression bipolar traits, possible asperger's ---issues with ETOH ism mainly binge pattern --conflicted over very religious parents and caught in middle of what to do with school and work   History of binge eating as well with weight gain and need to address addictive personality   Unclear safety margin at this time --awaits new outpatient plan     Med mgt groups, milieu Dual diagnosis  Issues family meetings, CW interventions --   Observation Level/Precautions:  15 minute checks  Laboratory:    Psychotherapy:  Daily   Medications:   zoloft naltrexone abilify  Consultations:  --TTS /SW   Discharge Concerns:  Safety  sobriety   Estimated LOS:  7   Other:     Physician Treatment Plan for Primary Diagnosis: <principal problem not specified> Long Term Goal(s): Improvement in symptoms so as ready for discharge  Short Term Goals:   Safety / mood support and related  Physician Treatment Plan for  Long Term Goal(s): Improvement in symptoms so as ready for discharge  Short Term Goals:  Sobriety, safety mood support --clarity of feelings   I certify that inpatient services furnished can reasonably be expected to improve the patient's condition.    Roselind Messier, MD 7/21/20211:31 PM

## 2019-12-13 NOTE — Progress Notes (Signed)
Recreation Therapy Notes    Date: 12/13/2019  Time: 9:30 am  Location: Courtyard   Behavioral response: Appropriate  Intervention Topic: Leisure  Discussion/Intervention:  Group content today was focused on leisure. The group defined what leisure is and some positive leisure activities they participate in. Individuals identified the difference between good and bad leisure. Participants expressed how they feel after participating in the leisure of their choice. The group discussed how they go about picking a leisure activity and if others are involved in their leisure activities. The patient stated how many leisure activities they have to choose from and reasons why it is important to have leisure time. Individuals participated in the intervention "Exploration of Leisure" where they had a chance to identify new leisure activities as well as benefits of leisure. Clinical Observations/Feedback:  Patient came to group late and was focused on what peers and staff had to say about leisure. Individual was social with peers and staff while participating in the intervention.  Tyrome Donatelli LRT/CTRS         Mahira Petras 12/13/2019 12:43 PM

## 2019-12-13 NOTE — BHH Suicide Risk Assessment (Signed)
[facility] Admission Suicide Risk Assessment   Nursing information obtained from:  Patient Demographic factors:  Male, Caucasian, Adolescent or young adult Current Mental Status:  Suicidal ideation indicated by patient Loss Factors:  NA Historical Factors:  Prior suicide attempts Risk Reduction Factors:  Positive social support  Total Time spent with patient: 1.5  hours Principal Problem: <principal problem not specified> Diagnosis:  Active Problems:   MDD (major depressive disorder), recurrent episode, severe (HCC)  Bipolar traits   Along with ETOH intoxication and dependence  Subjective Data:  Feels sad and confused   Continued Clinical Symptoms:  Alcohol Use Disorder Identification Test Final Score (AUDIT): 6 The "Alcohol Use Disorders Identification Test", Guidelines for Use in Primary Care, Second Edition.  World Science writer [facility]). Score between 0-7:  no or low risk or alcohol related problems. Score between 8-15:  moderate risk of alcohol related problems. Score between 16-19:  high risk of alcohol related problems. Score 20 or above:  warrants further diagnostic evaluation for alcohol dependence and treatment.   CLINICAL FACTORS:     Ongoing depression, bipolar traits and usually weekend binge ETOH pattern   Musculoskeletal: Strength & Muscle Tone: normal  Gait & Station: normal  Patient leans:   Psychiatric Specialty Exam: Physical Exam  Review of Systems  Blood pressure 122/60, pulse 75, temperature 98.7 F (37.1 C), temperature source Oral, resp. rate 17, height 5\' 11"  (1.803 m), weight 95.3 kg, SpO2 99 %.Body mass index is 29.3 kg/m.  Already written into the initial eval for MS same day                                                         COGNITIVE FEATURES THAT CONTRIBUTE TO RISK:  Depression, confusion with religion, self esteem issues     SUICIDE RISK:   moderate   PLAN OF CARE:  Inpatient then day treatment then IOP  then outpatient with med mgt and all therapy modes   I certify that inpatient services furnished can reasonably be expected to improve the patient's condition.   , MD 12/13/2019, 2:58 PM
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
## 2019-12-13 NOTE — Progress Notes (Signed)
Patient admitted from [facility] - ED, report received from Selena Batten, [place]. Patient came down at 0420 and was tired. Patient denies SI/HI/AVH at this time but did state he was feeling SI when he was drinking. Patient endorses anxiety and depression and states that is what he wants to work on while he is here. Skin check completed with [place], RN, no abnormalities found. No contraband found. Patient oriented to the unit and his room. Patient given education, support and encouragement to be active in his treatment plan. Patient being monitored Q 15 minutes for safety per unit protocol. Patient remains safe on the unit.

## 2019-12-13 NOTE — Progress Notes (Signed)
Recreation Therapy Notes  INPATIENT RECREATION THERAPY ASSESSMENT  Patient Details Name: Samay Delcarlo. MRN: [id number] DOB: 07/03/1995 Today's Date: 12/13/2019       Information Obtained From: Patient  Able to Participate in Assessment/Interview: Yes  Patient Presentation: Responsive  Reason for Admission (Per Patient): Active Symptoms, Suicidal Ideation, Substance Abuse  Patient Stressors:    Coping Skills:   Substance Abuse  Leisure Interests (2+):  Games - Video games, Social - Friends  Frequency of Recreation/Participation: Chief Executive Officer of Community Resources:  Yes  Community Resources:  Gym  Current Use:    If no, Barriers?:    Expressed Interest in [employer] Information:    [place]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

## 2019-12-13 NOTE — Plan of Care (Signed)
Patient new to the unit tonight, hasn't had time to progress  Problem: Education: Goal: Knowledge of [place] [facility] Education information/materials will improve Outcome: Not Progressing Goal: Emotional status will improve Outcome: Not Progressing Goal: Mental status will improve Outcome: Not Progressing Goal: Verbalization of understanding the information provided will improve Outcome: Not Progressing   Problem: Safety: Goal: Periods of time without injury will increase Outcome: Not Progressing   Problem: Education: Goal: Ability to make informed decisions regarding treatment will improve Outcome: Not Progressing   Problem: Medication: Goal: Compliance with prescribed medication regimen will improve Outcome: Not Progressing   

## 2019-12-13 NOTE — BH Assessment (Signed)
Patient is to be admitted to [facility] by Psychiatric Nurse Practitioner Gillermo Murdoch.  Attending Physician will be Dr. Toni Amend.   Patient has been assigned to room 305, by [facility] Charge Nurse Grace.     ER staff is aware of the admission:  [facility] ER Secretary    Dr. Manson Passey, ER MD   Cala Bradford Patient's Nurse   Vikki Ports Patient Access.

## 2019-12-13 NOTE — ED Notes (Signed)
Report given to Matt RN in BMU.  °

## 2019-12-13 NOTE — ED Notes (Signed)
Pt. Alert and oriented, warm and dry, in no distress. Pt. Denies HI, and AVH. Patient states having Si by anyway possible. Patient contracts for safety with this Clinical research associate. Patient states he has multiple SI attempts in the past. Patient states he has been drinking during the day and then went to work and work made him leave due to be intoxicated. Pt. Encouraged to let nursing staff know of any concerns or needs.

## 2019-12-13 NOTE — ED Notes (Signed)

## 2019-12-13 NOTE — BH Assessment (Signed)
Assessment Note  Joshua Ewalt. is an 24 y.o. male presenting to [facility] ED under IVC. Per triage note Pt to triage intoxicated and tearful. Pt confirmed that "if given the chance I would kill myself." pt very tearful and yelling. "I don't want to bother people by being alive." During assessment patient was alert and oriented x4, calm and cooperative, but appeared depressed. Patient reported why he was presenting to the ED "my mother and stepfather said I needed to come after a alcohol fed psychological episode." Patient reports "I like to drink to relax." Patient BAL is 212. Patient reports having SI earlier today "I thought about just laying in the road" but denies current SI. Patient does however report 2 prior attempts "the first time I used a helium tank with a CPR mask 4 years ago and the second attempt I cut myself with a knife." Patient proceeded to show several cut marks on his right arm. Patient reports that he is currently taking medications for "depression and anxiety" but also reports that he hasn't seen a psychiatrist "in years." Patient currently denies SI/HI/AH/VH and does not appear to be responding to any internal or external stimuli.  Per Psyc NP patient is recommended for Inpatient Hospitalization.   Diagnosis: Major Depressive Disorder, Alcohol Abuse  Past Medical History:  Past Medical History:  Diagnosis Date   Anxiety    Asthma     Past Surgical History:  Procedure Laterality Date   POLYPECTOMY     TONSILLECTOMY     WISDOM TOOTH EXTRACTION      Family History:  Family History  Problem Relation Age of Onset   Depression Mother    Depression Father    Mental illness Maternal Grandfather     Social History:  reports that he has never smoked. He has never used smokeless tobacco. He reports current alcohol use. He reports that he does not use drugs.  Additional Social History:  Alcohol / Drug Use Pain Medications: See MAR Prescriptions: See MAR Over  the Counter: See MAR History of alcohol / drug use?: Yes Substance #1 Name of Substance 1: Alcohol  CIWA: CIWA-Ar BP: (!) 138/97 Pulse Rate: (!) 110 COWS:    Allergies: No Known Allergies  Home Medications: (Not in a hospital admission)   OB/GYN Status:  No LMP for male patient.  General Assessment Data Location of Assessment: [facility] ED TTS Assessment: In system Is this a Tele or Face-to-Face Assessment?: Face-to-Face Is this an Initial Assessment or a Re-assessment for this encounter?: Initial Assessment Patient Accompanied by:: N/A Language Other than English: No Living Arrangements: Other (Comment) What gender do you identify as?: Male Marital status: Single Pregnancy Status: No Living Arrangements: Parent Can pt return to current living arrangement?: Yes Admission Status: Involuntary Petitioner: Other Is patient capable of signing voluntary admission?: No Referral Source: Other Insurance type: Scientist, research (physical sciences) Exam [facility] Walk-in ONLY) Medical Exam completed: Yes  Crisis Care Plan Living Arrangements: Parent Legal Guardian: Other: (Self) Name of Psychiatrist: Unknown Name of Therapist: None  Education Status Is patient currently in school?: No Is the patient employed, unemployed or receiving disability?: Employed  Risk to self with the past 6 months Suicidal Ideation: No-Not Currently/Within Last 6 Months Has patient been a risk to self within the past 6 months prior to admission? : No Suicidal Intent: No-Not Currently/Within Last 6 Months Has patient had any suicidal intent within the past 6 months prior to admission? : Yes Is patient at risk for  suicide?: Yes Suicidal Plan?: No-Not Currently/Within Last 6 Months Has patient had any suicidal plan within the past 6 months prior to admission? : Yes Access to Means:  (Patient has access to roads) What has been your use of drugs/alcohol within the last 12 months?: Alcohol Previous Attempts/Gestures:  Yes How many times?: 2 Other Self Harm Risks: None Triggers for Past Attempts: None known Intentional Self Injurious Behavior: None Family Suicide History: Unknown Recent stressful life event(s): Other (Comment) (None reported) Persecutory voices/beliefs?: No Depression: Yes Depression Symptoms: Fatigue, Loss of interest in usual pleasures, Feeling worthless/self pity Substance abuse history and/or treatment for substance abuse?: Yes Suicide prevention information given to non-admitted patients: Not applicable  Risk to Others within the past 6 months Homicidal Ideation: No Does patient have any lifetime risk of violence toward others beyond the six months prior to admission? : No Thoughts of Harm to Others: No Current Homicidal Intent: No Current Homicidal Plan: No Access to Homicidal Means: No Identified Victim: None History of harm to others?: No Assessment of Violence: None Noted Violent Behavior Description: None Does patient have access to weapons?: No Criminal Charges Pending?: No Does patient have a court date: No Is patient on probation?: No  Psychosis Hallucinations: None noted Delusions: None noted  Mental Status Report Appearance/Hygiene: In scrubs Eye Contact: Poor Motor Activity: Freedom of movement Speech: Pressured Level of Consciousness: Alert Mood: Depressed Affect: Appropriate to circumstance Anxiety Level: None Thought Processes: Coherent Judgement: Partial Orientation: Person, Place, Time, Situation, Appropriate for developmental age Obsessive Compulsive Thoughts/Behaviors: None  Cognitive Functioning Concentration: Normal Memory: Recent Intact, Remote Intact Is patient IDD: No Insight: Poor Impulse Control: Poor Appetite: Fair Have you had any weight changes? : No Change Sleep: Decreased Total Hours of Sleep: 5 Vegetative Symptoms: None  ADLScreening [facility] Assessment Services) Patient's cognitive ability adequate to safely complete daily  activities?: Yes Patient able to express need for assistance with ADLs?: Yes Independently performs ADLs?: Yes (appropriate for developmental age)  Prior Inpatient Therapy Prior Inpatient Therapy: Yes Prior Therapy Dates: Unknown Prior Therapy Facilty/Provider(s): Unknown Reason for Treatment: Suicide Attempt  Prior Outpatient Therapy Prior Outpatient Therapy: Yes Prior Therapy Dates: Unknown Prior Therapy Facilty/Provider(s): Unknown Reason for Treatment: Depression, Anxiety Does patient have an ACCT team?: No Does patient have Intensive In-House Services?  : No Does patient have [facility] services? : No Does patient have P4CC services?: No  ADL Screening (condition at time of admission) Patient's cognitive ability adequate to safely complete daily activities?: Yes Is the patient deaf or have difficulty hearing?: No Does the patient have difficulty seeing, even when wearing glasses/contacts?: No Does the patient have difficulty concentrating, remembering, or making decisions?: No Patient able to express need for assistance with ADLs?: Yes Does the patient have difficulty dressing or bathing?: No Independently performs ADLs?: Yes (appropriate for developmental age) Does the patient have difficulty walking or climbing stairs?: No Weakness of Legs: None Weakness of Arms/Hands: None  Home Assistive Devices/Equipment Home Assistive Devices/Equipment: None  Therapy Consults (therapy consults require a physician order) PT Evaluation Needed: No OT Evalulation Needed: No SLP Evaluation Needed: No Abuse/Neglect Assessment (Assessment to be complete while patient is alone) Abuse/Neglect Assessment Can Be Completed: Yes Physical Abuse: Denies Verbal Abuse: Denies Sexual Abuse: Denies Exploitation of patient/patient's resources: Denies Self-Neglect: Denies Values / Beliefs Cultural Requests During Hospitalization: None Spiritual Requests During Hospitalization:  None Consults Spiritual Care Consult Needed: No Transition of Care Team Consult Needed: No Advance Directives (For Healthcare) Does Patient Have a  Medical Advance Directive?: No Would patient like information on creating a medical advance directive?: No - Patient declined          Disposition: Per Psyc NP patient is recommended for Inpatient Hospitalization. Disposition Initial Assessment Completed for this Encounter: Yes  On Site Evaluation by:   Reviewed with Physician:    Benay Pike MS LCASA 12/13/2019 2:24 AM

## 2019-12-14 NOTE — Progress Notes (Signed)
BRIEF PHARMACY NOTE   This patient attended and participated in Medication Management Group counseling led by [facility] staff pharmacist.  This interactive class reviews basic information about prescription medications and education on personal responsibility in medication management.  The class also includes general knowledge of 3 main classes of behavioral medications, including antipsychotics, antidepressants, and mood stabilizers.     Patient behavior was appropriate for group setting.   Educational materials sourced from:  "Medication Do's and Don'ts" from [email or web address]   "Mental Health Medications" from [facility] of Mental Health FaxRack.tn.shtml#part 370488    Albina Billet, PharmD, BCPS Clinical Pharmacist 12/14/2019 3:14 PM

## 2019-12-14 NOTE — Progress Notes (Signed)
Recreation Therapy Notes  Date: 12/14/2019  Time: 9:30 am  Location: Court yard   Behavioral response: Appropriate   Intervention Topic: Animal Assisted Therapy   Discussion/Intervention:  Animal Assisted Therapy took place today during group.  Animal Assisted Therapy is the planned inclusion of an animal in a patient's treatment plan. The patients were able to engage in therapy with an animal during group. Participants were educated on what a service dog is and the different between a support dog and a service dog. Patient were informed on the many animal needs there are and how their needs are similar. Individuals were enlightened on the process to get a service animal or support animal. Patients got the opportunity to pet the animal and were offered emotional support from the animal and staff.  Clinical Observations/Feedback:  Patient came to group and was on topic and was focused on what peers and staff had to say. Participant shared their experiences and history with animals. Individual was social with peers, staff and animal while participating in group.  Jordynn Marcella LRT/CTRS          Mahogany Torrance 12/14/2019 11:58 AM

## 2019-12-14 NOTE — Tx Team (Signed)
Interdisciplinary Treatment and Diagnostic Plan Update  12/14/2019 Time of Session: 9:30AM Joshua Edwards. MRN: [id number]  Principal Diagnosis: <principal problem not specified>  Secondary Diagnoses: Active Problems:   MDD (major depressive disorder), recurrent episode, severe (HCC)   Current Medications:  Current Facility-Administered Medications  Medication Dose Route Frequency Provider Last Rate Last Admin   acetaminophen (TYLENOL) tablet 650 mg  650 mg Oral Q6H PRN Gillermo Murdoch, NP       alum & mag hydroxide-simeth (MAALOX/MYLANTA) 200-200-20 MG/5ML suspension 30 mL  30 mL Oral Q4H PRN Gillermo Murdoch, NP       hydrOXYzine (ATARAX/VISTARIL) tablet 25 mg  25 mg Oral Q6H PRN Gillermo Murdoch, NP       magnesium hydroxide (MILK OF MAGNESIA) suspension 30 mL  30 mL Oral Daily PRN Gillermo Murdoch, NP       mirtazapine (REMERON SOL-TAB) disintegrating tablet 15 mg  15 mg Oral QHS Roselind Messier, MD       sertraline (ZOLOFT) tablet 150 mg  150 mg Oral Daily Roselind Messier, MD   150 mg at 12/14/19 0759   traZODone (DESYREL) tablet 50 mg  50 mg Oral QHS PRN Gillermo Murdoch, NP       ziprasidone (GEODON) capsule 20 mg  20 mg Oral QHS Roselind Messier, MD       PTA Medications: Medications Prior to Admission  Medication Sig Dispense Refill Last Dose   albuterol (PROVENTIL HFA;VENTOLIN HFA) 108 (90 Base) MCG/ACT inhaler Inhale 2 puffs into the lungs every 4 (four) hours as needed for wheezing or shortness of breath (cough). 1 Inhaler 2    cetirizine (ZYRTEC) 10 MG tablet Take 1 tablet (10 mg total) by mouth daily. 30 tablet 11    fluticasone (FLONASE) 50 MCG/ACT nasal spray Place 2 sprays into both nostrils daily.      montelukast (SINGULAIR) 10 MG tablet Take 1 tablet (10 mg total) by mouth at bedtime. 90 tablet 3    sertraline (ZOLOFT) 100 MG tablet Take 1 tablet (100 mg total) by mouth daily. 90 tablet 3     Patient Stressors: Medication  change or noncompliance Substance abuse  Patient Strengths: Ability for insight Motivation for treatment/growth  Treatment Modalities: Medication Management, Group therapy, Case management,  1 to 1 session with clinician, Psychoeducation, Recreational therapy.   Physician Treatment Plan for Primary Diagnosis: <principal problem not specified> Long Term Goal(s): Improvement in symptoms so as ready for discharge Improvement in symptoms so as ready for discharge   Short Term Goals:    Medication Management: Evaluate patient's response, side effects, and tolerance of medication regimen.  Therapeutic Interventions: 1 to 1 sessions, Unit Group sessions and Medication administration.  Evaluation of Outcomes: Progressing  Physician Treatment Plan for Secondary Diagnosis: Active Problems:   MDD (major depressive disorder), recurrent episode, severe (HCC)  Long Term Goal(s): Improvement in symptoms so as ready for discharge Improvement in symptoms so as ready for discharge   Short Term Goals:       Medication Management: Evaluate patient's response, side effects, and tolerance of medication regimen.  Therapeutic Interventions: 1 to 1 sessions, Unit Group sessions and Medication administration.  Evaluation of Outcomes: Progressing   RN Treatment Plan for Primary Diagnosis: <principal problem not specified> Long Term Goal(s): Knowledge of disease and therapeutic regimen to maintain health will improve  Short Term Goals: Ability to demonstrate self-control, Ability to verbalize feelings will improve, Ability to disclose and discuss suicidal ideas, Ability to identify and develop effective coping behaviors  will improve and Compliance with prescribed medications will improve  Medication Management: RN will administer medications as ordered by provider, will assess and evaluate patient's response and provide education to patient for prescribed medication. RN will report any adverse and/or  side effects to prescribing provider.  Therapeutic Interventions: 1 on 1 counseling sessions, Psychoeducation, Medication administration, Evaluate responses to treatment, Monitor vital signs and CBGs as ordered, Perform/monitor CIWA, COWS, AIMS and Fall Risk screenings as ordered, Perform wound care treatments as ordered.  Evaluation of Outcomes: Progressing   LCSW Treatment Plan for Primary Diagnosis: <principal problem not specified> Long Term Goal(s): Safe transition to appropriate next level of care at discharge, Engage patient in therapeutic group addressing interpersonal concerns.  Short Term Goals: Engage patient in aftercare planning with referrals and resources, Increase social support, Increase ability to appropriately verbalize feelings, Increase emotional regulation, Identify triggers associated with mental health/substance abuse issues and Increase skills for wellness and recovery  Therapeutic Interventions: Assess for all discharge needs, 1 to 1 time with Social worker, Explore available resources and support systems, Assess for adequacy in community support network, Educate family and significant other(s) on suicide prevention, Complete Psychosocial Assessment, Interpersonal group therapy.  Evaluation of Outcomes: Progressing   Progress in Treatment: Attending groups: Yes. Participating in groups: Yes. Taking medication as prescribed: Yes. Toleration medication: Yes. Family/Significant other contact made: No, will contact:  Pt declined SPE with collaterals.  SPE completed with the patient.  Patient understands diagnosis: Yes. Discussing patient identified problems/goals with staff: Yes. Medical problems stabilized or resolved: Yes. Denies suicidal/homicidal ideation: Yes. Issues/concerns per patient self-inventory: No. Other: none  New problem(s) identified: No, Describe:  none  New Short Term/Long Term Goal(s): detox, elimination of symptoms of psychosis, medication  management for mood stabilization; elimination of SI thoughts; development of comprehensive mental wellness/sobriety plan.  Patient Goals:  "get my meds adjusted and get some AA"  Discharge Plan or Barriers: Pt reports plans to return home.  Patient reports plans to begin outpatient therapy upon discharge.  Patient has been given information on AA.   Reason for Continuation of Hospitalization: Anxiety Depression Medication stabilization Suicidal ideation Withdrawal symptoms  Estimated Length of Stay: 1-7 days  Attendees: Patient: Rosie, Joshua Edwards 12/14/2019 11:43 AM  Physician: Dr. Smith Robert, MD 12/14/2019 11:43 AM  Nursing: Torrie Mayers, RN 12/14/2019 11:43 AM  RN Care Manager: 12/14/2019 11:43 AM  Social Worker: Penni Homans, LCSW 12/14/2019 11:43 AM  Recreational Therapist:  12/14/2019 11:43 AM  Other:  12/14/2019 11:43 AM  Other:  12/14/2019 11:43 AM  Other: 12/14/2019 11:43 AM    Scribe for Treatment Team: Harden Mo, LCSW 12/14/2019 11:43 AM

## 2019-12-14 NOTE — Progress Notes (Signed)
Patient ID: Joshua Kinds., male   DOB: 1995-10-10, 24 y.o.   MRN: [id number]     [facility] MD Progress Note  12/14/2019 2:55 PM Joshua Kinds.  MRN:  [id number] Principal Psychiatric Diagnosis: <principal problem not specified> Diagnosis: Active Problems:   MDD (major depressive disorder), recurrent episode, severe (HCC) EToh Dependence  Binge eating disorder  Adjustment disorder    Total Time spent with patient:   15   DAILY NARRATIVE--   Slow improvement  He is cooperative and open to treatment  Trying to do Psychoeducational on dual diagnosis   On IVC awaits disposition safety plan and overall improvement   No signs of withdrawal from ETOH   Family meeting again in am   No side effects so far  Better sleep  His mood seems better but it is situational Will decide in am with parents what his day of discharge could be   Continues   Remeron 15 qhs Geodon 20 qhs Zoloft 150 mg po qam  Mental Status Pertinents only   Alert Cooperative oriented times four Not clouded or fluctuant --- Mood improving affect okay Speech normal Thought process and content normal SI and plans --pending safety NO HI  Concentration okay Appearance normal Cooperative rapport and eye contact okay No movement problems ADL's okay Cognition normal Sleep Normal  Judgement insight reliability improving  Intelligence fund of knowledge normal         Subjective:  I am okay learning a lot   Objective:   As above   No other new labs   Physical Findings: AIMS:  , ,  ,  ,    CIWA:    COWS:     PHYSICAL EXAM  Physical Exam  SYSTEMS REVIEW  Review of Systems    Risk to Self: What has been your use of drugs/alcohol within the last 12 months?: Alcohol: "yesterday I drunk a bottle of 40% alcohol moonshine from the store; 1-2x week a handle of alcohol" Risk to Others:       Current Medications: Current Facility-Administered Medications  Medication Dose Route  Frequency Provider Last Rate Last Admin  . acetaminophen (TYLENOL) tablet 650 mg  650 mg Oral Q6H PRN Gillermo Murdoch, NP      . alum & mag hydroxide-simeth (MAALOX/MYLANTA) 200-200-20 MG/5ML suspension 30 mL  30 mL Oral Q4H PRN Gillermo Murdoch, NP      . hydrOXYzine (ATARAX/VISTARIL) tablet 25 mg  25 mg Oral Q6H PRN Gillermo Murdoch, NP      . magnesium hydroxide (MILK OF MAGNESIA) suspension 30 mL  30 mL Oral Daily PRN Gillermo Murdoch, NP      . mirtazapine (REMERON SOL-TAB) disintegrating tablet 15 mg  15 mg Oral QHS Roselind Messier, MD      . sertraline (ZOLOFT) tablet 150 mg  150 mg Oral Daily Roselind Messier, MD   150 mg at 12/14/19 0759  . traZODone (DESYREL) tablet 50 mg  50 mg Oral QHS PRN Gillermo Murdoch, NP      . ziprasidone (GEODON) capsule 20 mg  20 mg Oral QHS Roselind Messier, MD        Lab Results:  Results for orders placed or performed during the hospital encounter of 12/12/19 (from the past 48 hour(s))  Comprehensive metabolic panel     Status: Abnormal   Collection Time: 12/12/19 11:35 PM  Result Value Ref Range   Sodium 143 135 - 145 mmol/L   Potassium 3.8 3.5 - 5.1 mmol/L  Comment: HEMOLYSIS AT THIS LEVEL MAY AFFECT RESULT   Chloride 108 98 - 111 mmol/L   CO2 24 22 - 32 mmol/L   Glucose, Bld 127 (H) 70 - 99 mg/dL    Comment: Glucose reference range applies only to samples taken after fasting for at least 8 hours.   BUN 16 6 - 20 mg/dL   Creatinine, Ser 4.091.07 0.61 - 1.24 mg/dL   Calcium 8.9 8.9 - 81.110.3 mg/dL   Total Protein 7.3 6.5 - 8.1 g/dL   Albumin 5.0 3.5 - 5.0 g/dL   AST 30 15 - 41 U/L   ALT 35 0 - 44 U/L   Alkaline Phosphatase 49 38 - 126 U/L   Total Bilirubin 0.8 0.3 - 1.2 mg/dL   GFR calc non Af Amer >60 >60 mL/min   GFR calc Af Amer >60 >60 mL/min   Anion gap 11 5 - 15    Comment: Performed at [facility], [address] [place], [place] [address]  Ethanol     Status: Abnormal   Collection Time: 12/12/19 11:35  PM  Result Value Ref Range   Alcohol, Ethyl (B) 212 (H) <10 mg/dL    Comment: (NOTE) Lowest detectable limit for serum alcohol is 10 mg/dL.  For medical purposes only. Performed at [facility], [address] [place], [place] [address]   Salicylate level     Status: Abnormal   Collection Time: 12/12/19 11:35 PM  Result Value Ref Range   Salicylate Lvl <7.0 (L) 7.0 - 30.0 mg/dL    Comment: Performed at [facility], [address] [place], [place] [address]  Acetaminophen level     Status: Abnormal   Collection Time: 12/12/19 11:35 PM  Result Value Ref Range   Acetaminophen (Tylenol), Serum <10 (L) 10 - 30 ug/mL    Comment: (NOTE) Therapeutic concentrations vary significantly. A range of 10-30 ug/mL  may be an effective concentration for many patients. However, some  are best treated at concentrations outside of this range. Acetaminophen concentrations >150 ug/mL at 4 hours after ingestion  and >50 ug/mL at 12 hours after ingestion are often associated with  toxic reactions.  Performed at [facility], [address] [place], [place] [address]   cbc     Status: Abnormal   Collection Time: 12/12/19 11:35 PM  Result Value Ref Range   WBC 4.5 4.0 - 10.5 K/uL   RBC 4.44 4.22 - 5.81 MIL/uL   Hemoglobin 14.6 13.0 - 17.0 g/dL   HCT 96.240.4 39 - 52 %   MCV 91.0 80.0 - 100.0 fL   MCH 32.9 26.0 - 34.0 pg   MCHC 36.1 (H) 30.0 - 36.0 g/dL   RDW 95.212.8 84.111.5 - 32.415.5 %   Platelets 199 150 - 400 K/uL   nRBC 0.0 0.0 - 0.2 %    Comment: Performed at [facility], [address] [place], [place] [address]  SARS Coronavirus 2 by RT PCR (hospital order, performed in [facility] hospital lab) Nasopharyngeal Nasopharyngeal Swab     Status: None   Collection Time: 12/13/19  2:09 AM   Specimen: Nasopharyngeal Swab  Result Value Ref Range   SARS Coronavirus 2 NEGATIVE NEGATIVE    Comment: (NOTE) SARS-CoV-2 target nucleic acids are NOT DETECTED.  The  SARS-CoV-2 RNA is generally detectable in upper and lower respiratory specimens during the acute phase of infection. The lowest concentration of SARS-CoV-2 viral copies this assay can detect is 250 copies /  mL. A negative result does not preclude SARS-CoV-2 infection and should not be used as the sole basis for treatment or other patient management decisions.  A negative result may occur with improper specimen collection / handling, submission of specimen other than nasopharyngeal swab, presence of viral mutation(s) within the areas targeted by this assay, and inadequate number of viral copies (<250 copies / mL). A negative result must be combined with clinical observations, patient history, and epidemiological information.  Fact Sheet for Patients:   BoilerBrush.com.cy  Fact Sheet for Healthcare Providers: https://pope.com/  This test is not yet approved or  cleared by the [place] FDA and has been authorized for detection and/or diagnosis of SARS-CoV-2 by FDA under an Emergency Use Authorization (EUA).  This EUA will remain in effect (meaning this test can be used) for the duration of the COVID-19 declaration under Section 564(b)(1) of the Act, 21 U.S.C. section 360bbb-3(b)(1), unless the authorization is terminated or revoked sooner.  Performed at [facility], [address] [place], [place] [address]     Blood Alcohol level:  Lab Results  Component Value Date   ETH 212 (H) 12/12/2019    Metabolic Disorder Labs: Lab Results  Component Value Date   HGBA1C 4.5 03/14/2019   MPG 82 03/14/2019   MPG 85 03/10/2018   No results found for: PROLACTIN Lab Results  Component Value Date   CHOL 220 (H) 03/14/2019   TRIG 85 03/14/2019   HDL 60 03/14/2019   CHOLHDL 3.7 03/14/2019   LDLCALC 141 (H) 03/14/2019   LDLCALC 122 (H) 03/10/2018     Past Psychiatric History:   Past Medical/Surgical History  Past  Medical History:  Diagnosis Date  . Anxiety   . Asthma     Past Surgical History:  Procedure Laterality Date  . POLYPECTOMY    . TONSILLECTOMY    . WISDOM TOOTH EXTRACTION      Family Medical and Psychiatric History:   Family History  Problem Relation Age of Onset  . Depression Mother   . Depression Father   . Mental illness Maternal Grandfather     Social History:   Social History   Substance and Sexual Activity  Alcohol Use Yes     Social History   Substance and Sexual Activity  Drug Use No    Social History   Socioeconomic History  . Marital status: Single    Spouse name: Not on file  . Number of children: Not on file  . Years of education: Not on file  . Highest education level: Not on file  Occupational History  . Occupation: Pending interview  Tobacco Use  . Smoking status: Never Smoker  . Smokeless tobacco: Never Used  Vaping Use  . Vaping Use: Never used  Substance and Sexual Activity  . Alcohol use: Yes  . Drug use: No  . Sexual activity: Never  Other Topics Concern  . Not on file  Social History Narrative  . Not on file   Social Determinants of Health   Financial Resource Strain:   . Difficulty of Paying Living Expenses:   Food Insecurity:   . Worried About Programme researcher, broadcasting/film/video in the Last Year:   . Barista in the Last Year:   Transportation Needs:   . Freight forwarder (Medical):   Marland Kitchen Lack of Transportation (Non-Medical):   Physical Activity:   . Days of Exercise per Week:   . Minutes of Exercise per Session:   Stress:   .  Feeling of Stress :   Social Connections:   . Frequency of Communication with Friends and Family:   . Frequency of Social Gatherings with Friends and Family:   . Attends Religious Services:   . Active Member of Clubs or Organizations:   . Attends Banker Meetings:   Marland Kitchen Marital Status:                             Summary:  Caucasian male recovering from ETOH binge and  dealing with Dual diagnosis issues --Post scratching self --and had voiced Si during intoxication Looks and feels better today and awaits safety plan   Prescription Plan:  See above  Estimated Length of Stay:    1-3 days    Roselind Messier, MD 12/14/2019, 2:55 PM

## 2019-12-14 NOTE — Plan of Care (Signed)
Patient is appropriate with staff & peers.Denies SI,HI and AVH.Patient states that he is realizing alcohol is not helping with his problems.Patient stated that he likes to continue the treatment.Attended groups.Compliant with medications.Appetite and energy level good.Support and encouragement given.

## 2019-12-15 MED ORDER — NALTREXONE HCL 50 MG PO TABS
25.0000 mg | ORAL_TABLET | Freq: Every day | ORAL | Status: DC
  Administered 2019-12-15: 25 mg via ORAL
  Filled 2019-12-15: qty 1

## 2019-12-15 MED ORDER — NALTREXONE HCL 50 MG PO TABS: 50.0000 mg | ORAL_TABLET | Freq: Every day | ORAL | Status: DC

## 2019-12-15 MED ORDER — MIRTAZAPINE 15 MG PO TBDP: 15.0000 mg | ORAL_TABLET | Freq: Every day | ORAL | 1 refills | Status: DC

## 2019-12-15 MED ORDER — ZIPRASIDONE HCL 40 MG PO CAPS: 40.0000 mg | ORAL_CAPSULE | Freq: Every day | ORAL | 1 refills | Status: DC

## 2019-12-15 MED ORDER — SERTRALINE HCL 25 MG PO TABS: 150.0000 mg | ORAL_TABLET | Freq: Every day | ORAL | 1 refills | Status: DC

## 2019-12-15 MED ORDER — NALTREXONE HCL 50 MG PO TABS: 50.0000 mg | ORAL_TABLET | Freq: Every day | ORAL | 1 refills | Status: DC

## 2019-12-15 MED ORDER — BENZTROPINE MESYLATE 0.5 MG PO TABS: 0.5000 mg | ORAL_TABLET | Freq: Every day | ORAL | 1 refills | Status: DC

## 2019-12-15 MED ORDER — ZIPRASIDONE HCL 40 MG PO CAPS: 40.0000 mg | ORAL_CAPSULE | Freq: Every day | ORAL | Status: DC

## 2019-12-15 MED ORDER — BENZTROPINE MESYLATE 1 MG PO TABS: 0.5000 mg | ORAL_TABLET | Freq: Every day | ORAL | Status: DC

## 2019-12-15 NOTE — Discharge Summary (Signed)
Physician Discharge Summary Note  Patient:  Joshua Edwards. is an 24 y.o., male MRN:  [id number] DOB:  11/24/1995 Patient phone:  [phone] (home)  Patient address:   [address] [place] [address],  Total Time spent with patient:   30 min   Date of Admission:  12/13/2019 Date of Discharge:   12/15/2019  Reason for Admission:  Dual diagnosis   Bipolar Disorder --mixed  Adjustment Disorder  Parent Child Issues  Post Traumatic Stress issues Binge    Principal Problem: <principal problem not specified> Discharge Diagnoses: Active Problems:   MDD (major depressive disorder), recurrent episode, severe (HCC)  See above    Past Psychiatric History: - previous --hospitals --one time before see HPI   Past Medical History:  Past Medical History:  Diagnosis Date  . Anxiety   . Asthma     Past Surgical History:  Procedure Laterality Date  . POLYPECTOMY    . TONSILLECTOMY    . WISDOM TOOTH EXTRACTION     Family History:  Family History  Problem Relation Age of Onset  . Depression Mother   . Depression Father   . Mental illness Maternal Grandfather    Family Psychiatric  History:  Social History:  Social History   Substance and Sexual Activity  Alcohol Use Yes     Social History   Substance and Sexual Activity  Drug Use No    Social History   Socioeconomic History  . Marital status: Single    Spouse name: Not on file  . Number of children: Not on file  . Years of education: Not on file  . Highest education level: Not on file  Occupational History  . Occupation: Pending interview  Tobacco Use  . Smoking status: Never Smoker  . Smokeless tobacco: Never Used  Vaping Use  . Vaping Use: Never used  Substance and Sexual Activity  . Alcohol use: Yes  . Drug use: No  . Sexual activity: Never  Other Topics Concern  . Not on file  Social History Narrative  . Not on file   Social Determinants of Health   Financial Resource Strain:    . Difficulty of Paying Living Expenses:   Food Insecurity:   . Worried About Programme researcher, broadcasting/film/video in the Last Year:   . Barista in the Last Year:   Transportation Needs:   . Freight forwarder (Medical):   Marland Kitchen Lack of Transportation (Non-Medical):   Physical Activity:   . Days of Exercise per Week:   . Minutes of Exercise per Session:   Stress:   . Feeling of Stress :   Social Connections:   . Frequency of Communication with Friends and Family:   . Frequency of Social Gatherings with Friends and Family:   . Attends Religious Services:   . Active Member of Clubs or Organizations:   . Attends Banker Meetings:   Marland Kitchen Marital Status:     Hospital Course:   Patient was admitted for ETOH intoxication---with suicidal thoughts.  He was upset over a long time due to ETOH use where ---he felt suppressed and controlled by strict [place] upbringing on one side of the family   He also did not recover from a previous break up.   He stopped school and worked but ETOH was heavier for him as time went on.  Parents on other side found him intoxicated and brought him to ER where he was voicing SI.   Had  been involuntary but then signed in voluntary   He was admitted and placed into milieu groups and MD rounds and therapy  We discussed his issues and did psychoeducation on medications; PTSD binge eating and all as well as ETOH   And how to treat as outpatient  His meds include Geodon 40 qhs zoloft 150 mg po daily  Cogentin 0.5 qhs And Naltrexone 50 1/2 to one daily  Remeron 15 mg po qhs   Went over meds with parent and patient They risks benefits side effects and how to follow up after psychoeducation  He agrees with parents to go home in safe manner with contract, had no SI HI or plans at discharge and will attend inpatient 30 day program Day treatment IOP and then outpatient therapy and mgt  FMLA for 4 weeks to be filled by primary MD  Or Day treatment  Parents and he  agreeable to safe discharge today   No other side effects or new medical problems at discharge   Physical Findings: AIMS:  , ,  ,  ,   negativ e CIWA:    COWS:     Musculoskeletal: Strength & Muscle Tone: normal  Gait & Station: normal  Patient leans:   Psychiatric Specialty Exam: Physical Exam  Review of Systems  Blood pressure 110/68, pulse 69, temperature 98 F (36.7 C), temperature source Oral, resp. rate 17, height 5\' 11"  (1.803 m), weight 95.3 kg, SpO2 99 %.Body mass index is 29.3 kg/m.  Mental Status at Discharge and for Suicide Discharge Assesment    Alert cooperative oriented to person place date and time Consciousness not clouded or fluctuant Mood and affect normal No severe anxiety  Movement problems --none Concentration and attention normal  Thought process and Content --no severe or other psychosis or mania Memory --remote recent and immediate okay Judgement insight reliability normal Speech --normal rate tone fluency volume Abstraction normal  Rapport --normal  Eye contact normal  SI and HI --no active SI HI or plans at discharge contracts for safety ADL's---normal Assets --willing to do treatment Supportive family  Liabilities --prone to addictions and mood Cognition normal  Sleep improved                                                     S     Have you used any form of tobacco in the last 30 days? (Cigarettes, Smokeless Tobacco, Cigars, and/or Pipes): No  Has this patient used any form of tobacco in the last 30 days? (Cigarettes, Smokeless Tobacco, Cigars, and/or Pipes)  none  Blood Alcohol level:  Lab Results  Component Value Date   ETH 212 (H) 12/12/2019    Metabolic Disorder Labs:  Lab Results  Component Value Date   HGBA1C 4.5 03/14/2019   MPG 82 03/14/2019   MPG 85 03/10/2018   No results found for: PROLACTIN Lab Results  Component Value Date   CHOL 220 (H) 03/14/2019   TRIG 85 03/14/2019   HDL 60  03/14/2019   CHOLHDL 3.7 03/14/2019   LDLCALC 141 (H) 03/14/2019   LDLCALC 122 (H) 03/10/2018    See Psychiatric Specialty Exam and Suicide Risk Assessment completed by Attending Physician prior to discharge.  Discharge destination:  Home to parents then inpatient or day treatment IOP or similar   Is patient on multiple antipsychotic  therapies at discharge:  N/a   Has Patient had three or more failed trials of antipsychotic monotherapy by history:  N/a   Recommended Plan for Multiple Antipsychotic Therapies: N/a      Follow-up Information    [facility] Go on 12/18/2019.   Why: Follow up is scheduled for 12/18/2019 at 12:30PM. Thanks! Contact information: 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

## 2019-12-15 NOTE — Progress Notes (Signed)
Recreation Therapy Notes  Date: 12/15/2019  Time: 9:30 am  Location: Craft room   Behavioral response: Appropriate  Intervention Topic: Happiness   Discussion/Intervention:  Group content today was focused on Happiness. The group defined happiness and described where happiness comes from. Individuals identified what makes them happy and how they go about making others happy. Patients expressed things that stop them from being happy and ways they can improve their happiness. The group stated reasons why it is important to be happy. The group participated in the intervention "My Happiness", where they had a chance to identify and express things that make them happy. Clinical Observations/Feedback:  Patient came to group late and was focused on what peers and staff had to say about happiness. He explained that video gaming makes him happy. Individual was social with peers and staff while participating in the intervention.  Carden Teel LRT/CTRS          Karene Bracken 12/15/2019 12:34 PM

## 2019-12-15 NOTE — Progress Notes (Signed)
D- Patient alert and oriented. Patient presents in a pleasant mood on assessment stating that he slept ok last night and had no complaints to voice to this Clinical research associate. Patient denies any signs/symptoms of depression/anxiety reporting that he is "very good, still waking up off the medicine, but I feel good overall". Patient also denies SI, HI, AVH, and pain at this time. Patient had no stated goals for today.  A- Scheduled medications administered to patient, per MD orders. Support and encouragement provided.  Routine safety checks conducted every 15 minutes.  Patient informed to notify staff with problems or concerns.  R- No adverse drug reactions noted. Patient contracts for safety at this time. Patient compliant with medications and treatment plan. Patient receptive, calm, and cooperative. Patient interacts well with others on the unit.  Patient remains safe at this time.

## 2019-12-15 NOTE — Progress Notes (Signed)
  [facility] Adult Case Management Discharge Plan :  Will you be returning to the same living situation after discharge:  Yes,  pt reports that he is returning home.  At discharge, do you have transportation home?: Yes,  pt reports that his mother will provide transportation. Do you have the ability to pay for your medications: Yes,  BCBS  Release of information consent forms completed and in the chart;  Patient's signature needed at discharge.  Patient to Follow up at:  Follow-up Information    [facility] Go on 12/18/2019.   Why: Follow up is scheduled for 12/18/2019 at 12:30PM. Thanks! Contact information: [address] [place] [address] [phone]               Next level of care provider has access to [facility] Link:no  Safety Planning and Suicide Prevention discussed: No.  SPE completed with patient.  Patient declined collateral contact.   Have you used any form of tobacco in the last 30 days? (Cigarettes, Smokeless Tobacco, Cigars, and/or Pipes): No  Has patient been referred to the Quitline?: Patient refused referral  Patient has been referred for addiction treatment: Pt. refused referral  Harden Mo, LCSW 12/15/2019, 10:53 AM

## 2019-12-15 NOTE — Progress Notes (Signed)
Recreation Therapy Notes  INPATIENT RECREATION TR PLAN  Patient Details Name: Joshua S Doubek Jr. MRN: [id number] DOB: 06/28/1995 Today's Date: 12/15/2019  Rec Therapy Plan Is patient appropriate for Therapeutic Recreation?: Yes Treatment times per week: at least 3 Estimated Length of Stay: 5-7 days TR Treatment/Interventions: Group participation (Comment)  Discharge Criteria Pt will be discharged from therapy if:: Discharged Treatment plan/goals/alternatives discussed and agreed upon by:: Patient/family  Discharge Summary Short term goals set: Patient will engage in groups without prompting or encouragement from LRT x3 group sessions within 5 recreation therapy group sessions Short term goals met: Complete Progress toward goals comments: Groups attended Which groups?: AAA/T, Other (Comment), Leisure education (Happiness) Reason goals not met: N/A Therapeutic equipment acquired: N/A Reason patient discharged from therapy: Discharge from hospital Pt/family agrees with progress & goals achieved: Yes Date patient discharged from therapy: 12/15/19      12/15/2019, 12:39 PM  

## 2019-12-15 NOTE — BHH Suicide Risk Assessment (Signed)
[facility] Discharge Suicide Risk Assessment   Principal Problem: <principal problem not specified> Discharge Diagnoses: Active Problems:   MDD (major depressive disorder), recurrent episode, severe (HCC)   Total Time spent with patient: 30-45  Musculoskeletal: Strength & Muscle Tone: normal  Gait & Station: normal  Patient leans:   Psychiatric Specialty Exam: Review of Systems  Blood pressure 110/68, pulse 69, temperature 98 F (36.7 C), temperature source Oral, resp. rate 17, height 5\' 11"  (1.803 m), weight 95.3 kg, SpO2 99 %.Body mass index is 29.3 kg/m.  Mental Status already written in Discharge Summary the same day                                                      Mental Status Per Nursing Assessment::   On Admission:  Suicidal ideation indicated by patient  Demographic Factors:   Demanding set of one parents vs the others college age --did not finish school now working self esteem issues   Loss Factors: Living with new parent set. Unresolved grief from previous GF    Historical Factors: Felt difficulties with self esteem and triggers when not finishing school and disagreeing with staunch Christian beliefs   Risk Reduction Factors:    AA NA   Groups, IOP day programs 30 day inpatient identify triggers, recognize and build self esteem ----go back to school communication with therapist and parents ---  Continued Clinical Symptoms:  Improving mood but always will have challenge of addictions   Cognitive Features That Contribute To Risk:  Stress and PTSD in dealing with past [id number] college as well as other set of parents, --guilt remorse shame blame and other issues classic for ETOH dependence problems     Suicide Risk:   Low at discharge    Follow-up Information    [facility] Go on 12/18/2019.   Why: Follow up is scheduled for 12/18/2019 at 12:30PM. Thanks! Contact information: [address] [place]  [place] [phone]               Plan Of Care/Follow-up recommendations:   Already stated in Discharge SUmmary  [phone], MD 12/15/2019, 10:38 AM

## 2019-12-15 NOTE — Progress Notes (Signed)
Patient ID: Joshua Kinds., male   DOB: 1995-07-31, 24 y.o.   MRN: [id number]   Discharge Note:  Patient denies SI/HI/AVH at this time. Discharge instructions, AVS, prescriptions, and transition record gone over with patient. Patient agrees to comply with medication management, follow-up visit, and outpatient therapy. Patient belongings returned to patient. Patient questions and concerns addressed and answered. Patient ambulatory off unit. Patient discharged to home with Mother.

## 2019-12-15 NOTE — Progress Notes (Signed)
Patient alert and oriented x 4, affect is flat but brightens upon approach he interacts appropriately with peers and staff, thoughts are organized and coherent, he denies SI/HI/AVH, complaint with medication regimen, 15 minutes safety checks maintained will continue to monitor.

## 2019-12-20 ENCOUNTER — Telehealth (INDEPENDENT_AMBULATORY_CARE_PROVIDER_SITE_OTHER): Payer: BC Managed Care – PPO | Admitting: Family Medicine

## 2019-12-20 ENCOUNTER — Encounter: Payer: Self-pay | Admitting: Family Medicine

## 2019-12-20 ENCOUNTER — Other Ambulatory Visit: Payer: Self-pay

## 2019-12-20 DIAGNOSIS — F1021 Alcohol dependence, in remission: Secondary | ICD-10-CM | POA: Insufficient documentation

## 2019-12-20 DIAGNOSIS — F1024 Alcohol dependence with alcohol-induced mood disorder: Secondary | ICD-10-CM

## 2019-12-20 DIAGNOSIS — F1020 Alcohol dependence, uncomplicated: Secondary | ICD-10-CM | POA: Insufficient documentation

## 2019-12-20 DIAGNOSIS — F332 Major depressive disorder, recurrent severe without psychotic features: Secondary | ICD-10-CM | POA: Diagnosis not present

## 2019-12-20 DIAGNOSIS — F331 Major depressive disorder, recurrent, moderate: Secondary | ICD-10-CM | POA: Diagnosis not present

## 2019-12-20 NOTE — Patient Instructions (Addendum)
Joshua Edwards  Both forms will be completed and faxed within 24 hours. Stay tuned for updates.  We will complete them to request short term disability / FMLA for approximately 4-6 weeks, anticipated return date is 01/24/20.  Keep in touch if you need anything sooner.  Keep on track with your upcoming schedule and programs for mood and alcohol treatment.  If thoughts of harming yourself or others, or any significant concern about your safety, please call for help immediately:  - [facility] 24 Hour Help Line [phone] or [phone] - Suicide prevention hotline 9852589755  - 911   Please schedule a Follow-up Appointment to: Return in about 4 weeks (around 01/17/2020) for 4 weeks, mychart video visit for mood, alcohol, return to work.  If you have any other questions or concerns, please feel free to call the office or send a message through MyChart. You may also schedule an earlier appointment if necessary.  Additionally, you may be receiving a survey about your experience at our office within a few days to 1 week by e-mail or mail. We value your feedback.  Saralyn Pilar, DO [facility], [place]

## 2019-12-20 NOTE — Progress Notes (Signed)
Subjective:    Patient ID: Joshua Kinds., male    DOB: [birth year], 24 y.o.   MRN: [id number]  Joshua Browe. is a 24 y.o. male presenting on 12/20/2019 for FMLA paper work, Depression, and Alcohol Problem  Virtual / Telehealth Encounter - Video Visit via MyChart The purpose of this virtual visit is to provide medical care while limiting exposure to the novel coronavirus (COVID19) for both patient and office staff.  Consent was obtained for remote visit:  Yes.   Answered questions that patient had about telehealth interaction:  Yes.   I discussed the limitations, risks, security and privacy concerns of performing an evaluation and management service by video/telephone. I also discussed with the patient that there may be a patient responsible charge related to this service. The patient expressed understanding and agreed to proceed.  Patient Location: Home Provider Location: [facility] (Office)   HPI   HOSPITAL FOLLOW-UP VISIT  Hospital/Location: [facility] Date of Admission: 12/13/19 Date of Discharge: 12/15/19 Transitions of care telephone call: Not completed.  Reason for Admission: Suicidal Ideation / Major Depression / Alcohol Primary (+Secondary) Diagnosis: Alcohol Dependence, Severe Major Depression with suicidal ideation.  FOLLOW-UP  - Hospital H&P and Discharge Summary have been reviewed - Patient presents today about 5 days after recent hospitalization. Brief summary of recent course, patient had symptoms of alcohol abuse intoxication, worsening depression and suicidal ideation while intoxicated making suicidal statements and having ideation, without attempt. He was brought in by parents. He has history of prior attempts. Long history of alcohol abuse since age 1. IVC'd. Stabilized in ED, admitted to Psychiatry inpatient service. He did sign for voluntary admission. - Reviewed inpatient hospital records from Psychiatry. - Discharged on current med  regimen see below.  - Today reports overall has done well after discharge. Symptoms of depression have significantly improved on higher dose Zoloft (150 now, from prior 100) and current regimen. His alcohol dependence has improved on naltrexone and he is awaiting further therapy program. He no longer has active suicidal ideation.  He reports to me that he is looking forward to upcoming program to resolve the alcohol problem. Improve his mood. 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 He has been with company a few years and that may help.  He is waiting on a call from [facility]  To schedule his upcoming 30 day Outpatient Alcohol Treatment - Psychiatry / therapist - and substance management  He will locate local AA group  Also he says migraine headache improved, DC'd Celery seed extract  I have reviewed the discharge medication list, and have reconciled the current and discharge medications today.     Depression screen [facility] 2/9 12/20/2019 03/20/2019 03/20/2019  Decreased Interest 1 1 0  Down, Depressed, Hopeless 1 0 0  PHQ - 2 Score 2 1 0  Altered sleeping 0 2 -  Tired, decreased energy 0 2 -  Change in appetite 1 2 -  Feeling bad or failure about yourself  1 1 -  Trouble concentrating 0 2 -  Moving slowly or fidgety/restless 0 0 -  Suicidal thoughts 0 0 -  PHQ-9 Score 4 10 -  Difficult doing work/chores Not difficult at all Somewhat difficult -    GAD 7 : Generalized Anxiety Score 12/20/2019 03/17/2018 09/15/2017 03/03/2017  Nervous, Anxious, on Edge 0 0 0 1  Control/stop worrying 1 0 0 2  Worry too much - different  things 1 1 0 2  Trouble relaxing 0 0 1 0  Restless 0 0 0 0  Easily annoyed or irritable 1 1 1 2   Afraid - awful might happen 0 0 0 0  Total GAD 7 Score 3 2 2 7   Anxiety Difficulty Not difficult at all Not difficult at all Not difficult at all Somewhat difficult      Social History   Tobacco Use  . Smoking status: Never  Smoker  . Smokeless tobacco: Never Used  Vaping Use  . Vaping Use: Never used  Substance Use Topics  . Alcohol use: Yes  . Drug use: No    Review of Systems Per HPI unless specifically indicated above     Objective:    There were no vitals taken for this visit.  Wt Readings from Last 3 Encounters:  12/13/19 210 lb 1.6 oz (95.3 kg)  12/12/19 210 lb 1.6 oz (95.3 kg)  03/20/19 210 lb (95.3 kg)    Physical Exam   Note examination was completely remotely via video observation objective data only  Gen - well-appearing, no acute distress or apparent pain, comfortable HEENT - eyes appear clear without discharge or redness Heart/Lungs - cannot examine virtually - observed no evidence of coughing or labored breathing. Skin - face visible today- no rash Neuro - awake, alert, oriented. No tremors. Psych - not anxious appearing. Mood is appropriate. Speech is normal. Thoughts are normal. No abnormal movement, or behaviors.   Results for orders placed or performed during the hospital encounter of 12/12/19  SARS Coronavirus 2 by RT PCR (hospital order, performed in [facility] hospital lab) Nasopharyngeal Nasopharyngeal Swab   Specimen: Nasopharyngeal Swab  Result Value Ref Range   SARS Coronavirus 2 NEGATIVE NEGATIVE  Comprehensive metabolic panel  Result Value Ref Range   Sodium 143 135 - 145 mmol/L   Potassium 3.8 3.5 - 5.1 mmol/L   Chloride 108 98 - 111 mmol/L   CO2 24 22 - 32 mmol/L   Glucose, Bld 127 (H) 70 - 99 mg/dL   BUN 16 6 - 20 mg/dL   Creatinine, Ser 12/14/19 0.61 - 1.24 mg/dL   Calcium 8.9 8.9 - [facility] mg/dL   Total Protein 7.3 6.5 - 8.1 g/dL   Albumin 5.0 3.5 - 5.0 g/dL   AST 30 15 - 41 U/L   ALT 35 0 - 44 U/L   Alkaline Phosphatase 49 38 - 126 U/L   Total Bilirubin 0.8 0.3 - 1.2 mg/dL   GFR calc non Af Amer >60 >60 mL/min   GFR calc Af Amer >60 >60 mL/min   Anion gap 11 5 - 15  Ethanol  Result Value Ref Range   Alcohol, Ethyl (B) 212 (H) <10 mg/dL  Salicylate  level  Result Value Ref Range   Salicylate Lvl <7.0 (L) 7.0 - 30.0 mg/dL  Acetaminophen level  Result Value Ref Range   Acetaminophen (Tylenol), Serum <10 (L) 10 - 30 ug/mL  cbc  Result Value Ref Range   WBC 4.5 4.0 - 10.5 K/uL   RBC 4.44 4.22 - 5.81 MIL/uL   Hemoglobin 14.6 13.0 - 17.0 g/dL   HCT 4.56 39 - 52 %   MCV 91.0 80.0 - 100.0 fL   MCH 32.9 26.0 - 34.0 pg   MCHC 36.1 (H) 30.0 - 36.0 g/dL   RDW 25.6 38.9 - 37.3 %   Platelets 199 150 - 400 K/uL   nRBC 0.0 0.0 - 0.2 %  Assessment & Plan:   Problem List Items Addressed This Visit    MDD (major depressive disorder), recurrent episode, severe (HCC)   Alcohol dependence with alcohol-induced mood disorder (HCC) - Primary      Reviewed Behavioral Health hospitalization and discharge recommendations and plans. Suicidal ideation - RESOLVED Alcohol dependence with alcohol induced mood disorder - improved. Major depression, recurrent severe - improved.  Plan Remain alcohol free, continue naltrexone (half for 5-7 days then 1 daily for alcohol prevention, can continue) for alcohol dependence Proceed with AA for future maintenance of sobriety  Proceed with 30 day outpatient treatment program for alcohol through [facility] Psychiatry/Therapy, additionally will provide treatment for his major depression component as well.  Continue current med regimen on discharge. - Geodon (Ziprasidone) 40mg  nightly - Zoloft 150mg  daily (25mg  tab x 6 = 150mg  daily) - this was increased from 100mg  - Cogentin 0.5mg  nightly - Remeron 15mg  ODT nightly  Will complete Short Term Disability and FMLA paperwork today. See below for details. Once forms completed, will sign, and prepare to be faxed on 12/21/19, scan to chart.  He will return in approximately 4 weeks for re-evaluation prior to return to work.  -----------------------------  Short Term Disability [employer] Initial visit 12/12/19 No prior treatment of same condition in past. Not work  related Primary diagnosis: Alcohol Dependence, with alcohol-induced mood disorder (F10.24) Secondary diagnosis: Severe recurrent major depression recurrent, without psychotic (F33.2) Hospitalized 7/21 - 7/23 Patient does have current behavioral health restriction/limitation preventing him from performing his job - unable to work due to while on current medications and due to severe major depression impairing his ability to work. Restrictions from 12/12/19 - 01/23/20 Expected return to work date: 01/24/20   FMLA Start 12/12/19 Estimate duration 4-6 weeks [x]  Inpatient Care [x]  Incapacity plus Treatment See above for dates.   No orders of the defined types were placed in this encounter.   Follow up plan: Return in about 4 weeks (around 01/17/2020) for 4 weeks, mychart video visit for mood, alcohol, return to work.  Patient verbalizes understanding with the above medical recommendations including the limitation of remote medical advice.  Specific follow-up and call-back criteria were given for patient to follow-up or seek medical care more urgently if needed.  Total duration of direct patient care provided via video conference: 20 minutes   [employer], DO [facility] 12/20/2019, 3:27 PM

## 2019-12-25 ENCOUNTER — Telehealth: Payer: Self-pay

## 2019-12-25 NOTE — Telephone Encounter (Signed)
Attempted to call patient, did not reach him.  I saw him virtually on 12/20/19  I have completed all of his paperwork, 2 different sets, both should have been faxed on 12/21/19.  These should have been scanned to chart as well.  -------------------------------  If his employer has not received it, we may need to re-fax or re-send these and contact the employer to make sure that they receive it.  Saralyn Pilar, DO [facility] 12/25/2019, 5:25 PM

## 2019-12-25 NOTE — Telephone Encounter (Signed)
Copied from CRM [id number]. Topic: General - Other >> Dec 25, 2019 11:59 AM Angela Nevin wrote: Patient calling to check status of FMLA paperwork that was supposed to be faxed to his employer.

## 2019-12-26 NOTE — Telephone Encounter (Signed)
Pt has called back re this fax, states [employer], did not receive, "Would pls re fax to [phone]?"

## 2019-12-26 NOTE — Telephone Encounter (Signed)
Thanks.  I tried calling him as well, did not reach him.  If he can confirm that the employer received everything, or maybe we have to call his employer later this week, then we are good.  Saralyn Pilar, DO [facility] 12/26/2019, 5:09 PM

## 2019-12-26 NOTE — Telephone Encounter (Signed)
Refax twice and called him back to confirm but patient did not picked up the phone so left detail message.

## 2019-12-27 NOTE — Telephone Encounter (Signed)
As per patient the company has received his paper work.

## 2020-01-16 ENCOUNTER — Other Ambulatory Visit: Payer: Self-pay | Admitting: Family Medicine

## 2020-01-16 ENCOUNTER — Other Ambulatory Visit: Payer: Self-pay

## 2020-01-16 ENCOUNTER — Encounter: Payer: Self-pay | Admitting: Family Medicine

## 2020-01-16 ENCOUNTER — Telehealth (INDEPENDENT_AMBULATORY_CARE_PROVIDER_SITE_OTHER): Payer: BC Managed Care – PPO | Admitting: Family Medicine

## 2020-01-16 DIAGNOSIS — Z0000 Encounter for general adult medical examination without abnormal findings: Secondary | ICD-10-CM

## 2020-01-16 DIAGNOSIS — F3341 Major depressive disorder, recurrent, in partial remission: Secondary | ICD-10-CM | POA: Diagnosis not present

## 2020-01-16 DIAGNOSIS — Z79899 Other long term (current) drug therapy: Secondary | ICD-10-CM

## 2020-01-16 DIAGNOSIS — Z1159 Encounter for screening for other viral diseases: Secondary | ICD-10-CM

## 2020-01-16 DIAGNOSIS — F1021 Alcohol dependence, in remission: Secondary | ICD-10-CM

## 2020-01-16 DIAGNOSIS — Z114 Encounter for screening for human immunodeficiency virus [HIV]: Secondary | ICD-10-CM

## 2020-01-16 DIAGNOSIS — G4726 Circadian rhythm sleep disorder, shift work type: Secondary | ICD-10-CM

## 2020-01-16 NOTE — Addendum Note (Signed)
Addended by: Smitty Cords on: 01/16/2020 01:06 PM   Modules accepted: Orders

## 2020-01-16 NOTE — Patient Instructions (Addendum)
Keep on track with current medications and Psychiatry follow-up  Ask them about the Naltrexone / Benztropine duration of therapy.  Good luck with return to work on 01/24/20, hope you can get 1st shift soon.  DUE for FASTING BLOOD WORK (no food or drink after midnight before the lab appointment, only water or coffee without cream/sugar on the morning of)  KEEP ALREADY SCHEDULED "Lab Only" visit in the morning at the clinic for lab draw on 03/18/20  - Make sure Lab Only appointment is at about 1 week before your next appointment, so that results will be available  For Lab Results, once available within 2-3 days of blood draw, you can can log in to MyChart online to view your results and a brief explanation. Also, we can discuss results at next follow-up visit.   Please schedule a Follow-up Appointment to: Return in about 2 months (around 03/18/2020) for Keep upcoming apt fasting lab and annual physical October 2021.  If you have any other questions or concerns, please feel free to call the office or send a message through MyChart. You may also schedule an earlier appointment if necessary.  Additionally, you may be receiving a survey about your experience at our office within a few days to 1 week by e-mail or mail. We value your feedback.  Saralyn Pilar, DO [facility], [place]

## 2020-01-16 NOTE — Progress Notes (Signed)
Subjective:    Patient ID: Joshua Kinds., male    DOB: 1996-05-14, 24 y.o.   MRN: [id number]  Joshua Edwards. is a 24 y.o. male presenting on 01/16/2020 for Depression (follow up)  Virtual / Telehealth Encounter - Video Visit via MyChart The purpose of this virtual visit is to provide medical care while limiting exposure to the novel coronavirus (COVID19) for both patient and office staff.  Consent was obtained for remote visit:  Yes.   Answered questions that patient had about telehealth interaction:  Yes.   I discussed the limitations, risks, security and privacy concerns of performing an evaluation and management service by video/telephone. I also discussed with the patient that there may be a patient responsible charge related to this service. The patient expressed understanding and agreed to proceed.  Patient Location: Home Provider Location: [facility] (Office)   HPI   FOLLOW-UP  Major Depression Recurrent, moderate - in partial remission Alcohol Dependence with mood disorder  - Last visit with me virtual video visit on 12/20/19, for same problem, treated with continued psychiatric medication and referral to [facility] for therapy outpatient and 30 day alcohol program, see prior notes for background information. - Interval update with significant improvement on treatment and therapy - Today patient reports he is doing very well on current plan. He is alcohol free for past 30 days, and doing well with reduced alcohol cravings, he is on naltrexone, asking how long needs to take it, has 1 refill, he has apt tomorrow with [facility] to discuss the medications and he will check with them. - He is still on meds - Geodon 40mg  nightly, Sertraline 150mg  daily (25mg  x 6 tabs), Mirtazapine 15mg  ODT nightly, Benztropine 0.5mg  nightly. - He was on prior night shift had shift work sleep disorder insomnia. 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, different office - He will continue local AA group - Denies any new suicidal or homicidal ideation, anxiety panic worsening insomnia or mood   Depression screen [facility] 2/9 01/16/2020 12/20/2019 03/20/2019  Decreased Interest 0 1 1  Down, Depressed, Hopeless 0 1 0  PHQ - 2 Score 0 2 1  Altered sleeping 1 0 2  Tired, decreased energy 0 0 2  Change in appetite 1 1 2   Feeling bad or failure about yourself  0 1 1  Trouble concentrating 0 0 2  Moving slowly or fidgety/restless 0 0 0  Suicidal thoughts 0 0 0  PHQ-9 Score 2 4 10   Difficult doing work/chores Not difficult at all Not difficult at all Somewhat difficult   GAD 7 : Generalized Anxiety Score 01/16/2020 12/20/2019 03/17/2018 09/15/2017  Nervous, Anxious, on Edge 0 0 0 0  Control/stop worrying 0 1 0 0  Worry too much - different things 1 1 1  0  Trouble relaxing 0 0 0 1  Restless 0 0 0 0  Easily annoyed or irritable 0 1 1 1   Afraid - awful might happen 0 0 0 0  Total GAD 7 Score 1 3 2 2   Anxiety Difficulty Not difficult at all Not difficult at all Not difficult at all Not difficult at all     Social History   Tobacco Use  . Smoking status: Never Smoker  . Smokeless  tobacco: Never Used  Vaping Use  . Vaping Use: Never used  Substance Use Topics  . Alcohol use: Yes  . Drug use: No    Review of Systems Per HPI unless specifically indicated above     Objective:    There were no vitals taken for this visit.  Wt Readings from Last 3 Encounters:  12/12/19 210 lb 1.6 oz (95.3 kg)  03/20/19 210 lb (95.3 kg)  02/22/19 205 lb (93 kg)    Physical Exam   Note examination was completely remotely via video observation objective data only  Gen - well-appearing, no acute distress or apparent pain, comfortable HEENT - eyes appear clear without discharge or  redness Heart/Lungs - cannot examine virtually - observed no evidence of coughing or labored breathing. Skin - face visible today- no rash Neuro - awake, alert, oriented. No tremors. Psych - not anxious appearing. Mood is good. Speech is normal. Thoughts are normal. No abnormal movement, or behaviors.  Results for orders placed or performed during the hospital encounter of 12/12/19  SARS Coronavirus 2 by RT PCR (hospital order, performed in [facility] hospital lab) Nasopharyngeal Nasopharyngeal Swab   Specimen: Nasopharyngeal Swab  Result Value Ref Range   SARS Coronavirus 2 NEGATIVE NEGATIVE  Comprehensive metabolic panel  Result Value Ref Range   Sodium 143 135 - 145 mmol/L   Potassium 3.8 3.5 - 5.1 mmol/L   Chloride 108 98 - 111 mmol/L   CO2 24 22 - 32 mmol/L   Glucose, Bld 127 (H) 70 - 99 mg/dL   BUN 16 6 - 20 mg/dL   Creatinine, Ser 3.50 0.61 - 1.24 mg/dL   Calcium 8.9 8.9 - 09.3 mg/dL   Total Protein 7.3 6.5 - 8.1 g/dL   Albumin 5.0 3.5 - 5.0 g/dL   AST 30 15 - 41 U/L   ALT 35 0 - 44 U/L   Alkaline Phosphatase 49 38 - 126 U/L   Total Bilirubin 0.8 0.3 - 1.2 mg/dL   GFR calc non Af Amer >60 >60 mL/min   GFR calc Af Amer >60 >60 mL/min   Anion gap 11 5 - 15  Ethanol  Result Value Ref Range   Alcohol, Ethyl (B) 212 (H) <10 mg/dL  Salicylate level  Result Value Ref Range   Salicylate Lvl <7.0 (L) 7.0 - 30.0 mg/dL  Acetaminophen level  Result Value Ref Range   Acetaminophen (Tylenol), Serum <10 (L) 10 - 30 ug/mL  cbc  Result Value Ref Range   WBC 4.5 4.0 - 10.5 K/uL   RBC 4.44 4.22 - 5.81 MIL/uL   Hemoglobin 14.6 13.0 - 17.0 g/dL   HCT 81.8 39 - 52 %   MCV 91.0 80.0 - 100.0 fL   MCH 32.9 26.0 - 34.0 pg   MCHC 36.1 (H) 30.0 - 36.0 g/dL   RDW 29.9 37.1 - 69.6 %   Platelets 199 150 - 400 K/uL   nRBC 0.0 0.0 - 0.2 %      Assessment & Plan:   Problem List Items Addressed This Visit    Shift work sleep disorder   Major depressive disorder, recurrent episode, in  partial remission (HCC) - Primary   Alcohol dependence with alcohol-induced mood disorder (HCC)     Significant interval improvement in past 4-6 weeks since recent Behavioral Health discharge / hospitalization. Suicidal ideation - RESOLVED. No evidence recurrence.  Major depression, recurrent partial remission now - IMPROVED  Alcohol dependence with alcohol induced mood disorder - in  EARLY REMISSION (without alcohol intake for 30 days)  Plan Encourage to continue with AA and Psych outpatient program, to remain alcohol free, continue naltrexone for alcohol dependence, he may follow up with Psych on the refill and duration of therapy  Continue [facility] Psychiatry and therapy  Continue current med regimen - Geodon (Ziprasidone) 40mg  nightly - Zoloft 150mg  daily (25mg  tab x 6 = 150mg  daily) - this was increased from 100mg  - Cogentin 0.5mg  nightly - Remeron 15mg  ODT nightly  His recent short term disability paperwork / FMLA was approved and he is cleared to return to work on 01/24/20.  No orders of the defined types were placed in this encounter.     Follow up plan: Return in about 2 months (around 03/18/2020) for Keep upcoming apt fasting lab and annual physical October 2021.  - Return in October 2021 as scheduled already for labs and physical - Future labs ordered for 03/18/20 add on routine TSH, HIV Hep C  Patient verbalizes understanding with the above medical recommendations including the limitation of remote medical advice.  Specific follow-up and call-back criteria were given for patient to follow-up or seek medical care more urgently if needed.  Total duration of direct patient care provided via video conference: 20 minutes   , DO [facility] 01/16/2020, 10:00 AM

## 2020-03-15 ENCOUNTER — Other Ambulatory Visit: Payer: Self-pay | Admitting: *Deleted

## 2020-03-15 DIAGNOSIS — Z114 Encounter for screening for human immunodeficiency virus [HIV]: Secondary | ICD-10-CM

## 2020-03-15 DIAGNOSIS — Z1159 Encounter for screening for other viral diseases: Secondary | ICD-10-CM

## 2020-03-15 DIAGNOSIS — Z79899 Other long term (current) drug therapy: Secondary | ICD-10-CM

## 2020-03-15 DIAGNOSIS — Z0000 Encounter for general adult medical examination without abnormal findings: Secondary | ICD-10-CM

## 2020-03-18 ENCOUNTER — Other Ambulatory Visit: Payer: Managed Care, Other (non HMO)

## 2020-03-19 ENCOUNTER — Other Ambulatory Visit: Payer: Self-pay

## 2020-03-19 LAB — CBC WITH DIFFERENTIAL/PLATELET
Absolute Monocytes: 353 cells/uL (ref 200–950)
Basophils Absolute: 28 cells/uL (ref 0–200)
Basophils Relative: 0.6 %
Eosinophils Absolute: 169 cells/uL (ref 15–500)
Eosinophils Relative: 3.6 %
HCT: 44.9 % (ref 38.5–50.0)
Hemoglobin: 15.4 g/dL (ref 13.2–17.1)
Lymphs Abs: 1377 cells/uL (ref 850–3900)
MCH: 32.8 pg (ref 27.0–33.0)
MCHC: 34.3 g/dL (ref 32.0–36.0)
MCV: 95.5 fL (ref 80.0–100.0)
MPV: 10.6 fL (ref 7.5–12.5)
Monocytes Relative: 7.5 %
Neutro Abs: 2773 cells/uL (ref 1500–7800)
Neutrophils Relative %: 59 %
Platelets: 190 10*3/uL (ref 140–400)
RBC: 4.7 10*6/uL (ref 4.20–5.80)
RDW: 13 % (ref 11.0–15.0)
Total Lymphocyte: 29.3 %
WBC: 4.7 10*3/uL (ref 3.8–10.8)

## 2020-03-19 LAB — LIPID PANEL
Cholesterol: 208 mg/dL — ABNORMAL HIGH (ref <200)
HDL: 39 mg/dL — ABNORMAL LOW (ref >=40)
Non-HDL Cholesterol (Calc): 169 mg/dL (calc) — ABNORMAL HIGH (ref <130)
Total CHOL/HDL Ratio: 5.3 (calc) — ABNORMAL HIGH (ref <5.0)
Triglycerides: 472 mg/dL — ABNORMAL HIGH (ref <150)

## 2020-03-19 LAB — COMPLETE METABOLIC PANEL WITH GFR
AG Ratio: 2.4 (calc) (ref 1.0–2.5)
ALT: 20 U/L (ref 9–46)
AST: 19 U/L (ref 10–40)
Albumin: 4.8 g/dL (ref 3.6–5.1)
Alkaline phosphatase (APISO): 47 U/L (ref 36–130)
BUN: 15 mg/dL (ref 7–25)
CO2: 29 mmol/L (ref 20–32)
Calcium: 9.7 mg/dL (ref 8.6–10.3)
Chloride: 101 mmol/L (ref 98–110)
Creat: 0.88 mg/dL (ref 0.60–1.35)
GFR, Est African American: 138 mL/min/{1.73_m2} (ref >=60)
GFR, Est Non African American: 119 mL/min/{1.73_m2} (ref >=60)
Globulin: 2 g/dL (calc) (ref 1.9–3.7)
Glucose, Bld: 93 mg/dL (ref 65–99)
Potassium: 3.7 mmol/L (ref 3.5–5.3)
Sodium: 139 mmol/L (ref 135–146)
Total Bilirubin: 0.9 mg/dL (ref 0.2–1.2)
Total Protein: 6.8 g/dL (ref 6.1–8.1)

## 2020-03-19 LAB — TSH: TSH: 2.51 mIU/L (ref 0.40–4.50)

## 2020-03-19 LAB — HIV ANTIBODY (ROUTINE TESTING W REFLEX): HIV 1&2 Ab, 4th Generation: NONREACTIVE

## 2020-03-19 LAB — HEMOGLOBIN A1C
Hgb A1c MFr Bld: 4.5 % of total Hgb (ref <5.7)
Mean Plasma Glucose: 82 (calc)
eAG (mmol/L): 4.6 (calc)

## 2020-03-19 LAB — HEPATITIS C ANTIBODY
Hepatitis C Ab: NONREACTIVE
SIGNAL TO CUT-OFF: 0.01 (ref <1.00)

## 2020-03-22 ENCOUNTER — Encounter: Payer: BC Managed Care – PPO | Admitting: Family Medicine

## 2020-03-27 ENCOUNTER — Encounter: Payer: Self-pay | Admitting: Family Medicine

## 2020-03-27 ENCOUNTER — Other Ambulatory Visit: Payer: Self-pay

## 2020-03-27 ENCOUNTER — Ambulatory Visit (INDEPENDENT_AMBULATORY_CARE_PROVIDER_SITE_OTHER): Payer: Managed Care, Other (non HMO) | Admitting: Family Medicine

## 2020-03-27 VITALS — BP 124/68 | HR 65 | Temp 97.5°F | Resp 16 | Ht 70.0 in | Wt 219.8 lb

## 2020-03-27 DIAGNOSIS — G4726 Circadian rhythm sleep disorder, shift work type: Secondary | ICD-10-CM | POA: Diagnosis not present

## 2020-03-27 DIAGNOSIS — E782 Mixed hyperlipidemia: Secondary | ICD-10-CM | POA: Diagnosis not present

## 2020-03-27 DIAGNOSIS — E669 Obesity, unspecified: Secondary | ICD-10-CM

## 2020-03-27 DIAGNOSIS — F3341 Major depressive disorder, recurrent, in partial remission: Secondary | ICD-10-CM

## 2020-03-27 DIAGNOSIS — Z0000 Encounter for general adult medical examination without abnormal findings: Secondary | ICD-10-CM

## 2020-03-27 NOTE — Patient Instructions (Addendum)
Thank you for coming to the office today.  Goal to improve diet Keep on current dose of Fish Oil, in future we may advise change of up to 2,000mg  twice a day with meal.  Give the nightmares more time off the medicine, and goal for things to reset. Keep on melatonin if helpful  Future consider sleep study if still awakening.  [facility]) [place] [address] [place], [place] [address] Phone: [phone]  Check with [facility] and find out which Psychiatrist is assigned to you or had been ordering the medicine, and see if they would prefer to see you once a year - if your primary doctor now will take over the medicine, or if they will see you only as needed if we need help in the future.  Let me know 1-2 weeks in advance when ready for refill Sertraline/Zoloft   DUE for FASTING BLOOD WORK (no food or drink after midnight before the lab appointment, only water or coffee without cream/sugar on the morning of)  SCHEDULE "Lab Only" visit in the morning at the clinic for lab draw in 3 MONTHS   - Make sure Lab Only appointment is at about 1 week before your next appointment, so that results will be available  For Lab Results, once available within 2-3 days of blood draw, you can can log in to MyChart online to view your results and a brief explanation. Also, we can discuss results at next follow-up visit.    Please schedule a Follow-up Appointment to: Return in about 3 months (around 06/27/2020) for 3 month fasting lipid panel then follow-up lab result in person or virtual mychart.  If you have any other questions or concerns, please feel free to call the office or send a message through MyChart. You may also schedule an earlier appointment if necessary.  Additionally, you may be receiving a survey about your experience at our office within a few days to 1 week by e-mail or mail. We value your feedback.  Saralyn Pilar, DO [facility],  [facility]   Preventing High Cholesterol Cholesterol is a white, waxy substance similar to fat that the human body needs to help build cells. The liver makes all the cholesterol that a person's body needs. Having high cholesterol (hypercholesterolemia) increases a person's risk for heart disease and stroke. Extra (excess) cholesterol comes from the food the person eats. High cholesterol can often be prevented with diet and lifestyle changes. If you already have high cholesterol, you can control it with diet and lifestyle changes and with medicine. How can high cholesterol affect me? If you have high cholesterol, deposits (plaques) may build up on the walls of your arteries. The arteries are the blood vessels that carry blood away from your heart. Plaques make the arteries narrower and stiffer. This can limit or block blood flow and cause blood clots to form. Blood clots:  Are tiny balls of cells that form in your blood.  Can move to the heart or brain, causing a heart attack or stroke. Plaques in arteries greatly increase your risk for heart attack and stroke.Making diet and lifestyle changes can reduce your risk for these conditions that may threaten your life. What can increase my risk? This condition is more likely to develop in people who:  Eat foods that are high in saturated fat or cholesterol. Saturated fat is mostly found in: ? Foods that contain animal fat, such as red meat and some dairy products. ? Certain fatty foods  made from plants, such as tropical oils.  Are overweight.  Are not getting enough exercise.  Have a family history of high cholesterol. What actions can I take to prevent this? Nutrition   Eat less saturated fat.  Avoid trans fats (partially hydrogenated oils). These are often found in margarine and in some baked goods, fried foods, and snacks bought in packages.  Avoid precooked or cured meat, such as sausages or meat loaves.  Avoid foods and drinks that have  added sugars.  Eat more fruits, vegetables, and whole grains.  Choose healthy sources of protein, such as fish, poultry, lean cuts of red meat, beans, peas, lentils, and nuts.  Choose healthy sources of fat, such as: ? Nuts. ? Vegetable oils, especially olive oil. ? Fish that have healthy fats (omega-3 fatty acids), such as mackerel or salmon. The items listed above may not be a complete list of recommended foods and beverages. Contact a dietitian for more information. Lifestyle  Lose weight if you are overweight. Losing 5-10 lb (2.3-4.5 kg) can help prevent or control high cholesterol. It can also lower your risk for diabetes and high blood pressure. Ask your health care provider to help you with a diet and exercise plan to lose weight safely.  Do not use any products that contain nicotine or tobacco, such as cigarettes, e-cigarettes, and chewing tobacco. If you need help quitting, ask your health care provider.  Limit your alcohol intake. ? Do not drink alcohol if:  Your health care provider tells you not to drink.  You are pregnant, may be pregnant, or are planning to become pregnant. ? If you drink alcohol:  Limit how much you use to:  0-1 drink a day for women.  0-2 drinks a day for men.  Be aware of how much alcohol is in your drink. In the [place] one drink equals one 12 oz bottle of beer (355 mL), one 5 oz glass of wine (148 mL), or one 1 oz glass of hard liquor (44 mL). Activity   Get enough exercise. Each week, do at least 150 minutes of exercise that takes a medium level of effort (moderate-intensity exercise). ? This is exercise that:  Makes your heart beat faster and makes you breathe harder than usual.  Allows you to still be able to talk. ? You could exercise in short sessions several times a day or longer sessions a few times a week. For example, on 5 days each week, you could walk fast or ride your bike 3 times a day for 10 minutes each time.  Do exercises  as told by your health care provider. Medicines  In addition to diet and lifestyle changes, your health care provider may recommend medicines to help lower cholesterol. This may be a medicine to lower the amount of cholesterol your liver makes. You may need medicine if: ? Diet and lifestyle changes do not lower your cholesterol enough. ? You have high cholesterol and other risk factors for heart disease or stroke.  Take over-the-counter and prescription medicines only as told by your health care provider. General information  Manage your risk factors for high cholesterol. Talk with your health care provider about all your risk factors and how to lower your risk.  Manage other conditions that you have, such as diabetes or high blood pressure (hypertension).  Have blood tests to check your cholesterol levels at regular points in time as told by your health care provider.  Keep all follow-up visits as told by  your health care provider. This is important. Where to find more information  American Heart Association: www.heart.org  National Heart, Lung, and Blood Institute: PopSte[employer].is Summary  High cholesterol increases your risk for heart disease and stroke. By keeping your cholesterol level low, you can reduce your risk for these conditions.  High cholesterol can often be prevented with diet and lifestyle changes.  Work with your health care provider to manage your risk factors, and have your blood tested regularly. This information is not intended to replace advice given to you by your health care provider. Make sure you discuss any questions you have with your health care provider. Document Revised: 09/02/2018 Document Reviewed: 01/18/2016 Elsevier Patient Education  2020 [employer].

## 2020-03-27 NOTE — Progress Notes (Signed)
Subjective:    Patient ID: Joshua KindsJeffrey S Rittenhouse Jr., male    DOB: 01/19/1996, 24 y.o.   MRN: [id number]  Joshua KindsJeffrey S Pralle Jr. is a 24 y.o. male presenting on 03/27/2020 for Annual Exam   HPI   Annual Physical and Lab Review.  Lifestyle / Obesity BMI >31 Weight gain Diet: - He stopped keto diet due to preference. Eats balanced diet, limit sugar, eats whole grains brown rice, fruit vegetables, drinks mostly water, some oat milk. Occasional alcohol still Exercise - limited now due to COVID pandemic not going to gym  HYPERLIPIDEMIA: - Reports no concerns. Last lipid panel 02/2020, elevated TG >400, now cannot calc LDL Taking Fish Oil he drank some craft beer [place] to lab draw goal weight loss to be < 200, or 195 lbs - weight already down from 240 lbs.  FOLLOW-UPAllergies (Environmental, Cat Dander) / Asthma (Mild Intermittent) Mostly Spring / Summer, would wear goggles if need - Continues meds- Taking Cetirizine 10mg  OTC daily, Singulair 10mg  nightly, generic Fluticasone 2 sprays OTC each nare daily - No recent asthma flare up, not using albuterol inhaler recently - No hospitalizations or ED visits, not required prednisone or other treatment for asthma flare  FOLLOW-UP DEPRESSION, Anxiety No longer followed by Psych [facility] has not returned He is off other meds, now only on Sertraline/Zoloft 25mg  x 6 = 150mg  daily has plenty of med he had few weeks of nightmares after stopped mirtazapine, he then had reduced nightmares, but still waking up 2-3 times a night - he drinks sleepy time tea etc and not taking medication came off 3rd shift 2.5 months ago, now trying to adjust.    Health Maintenance: UTD Flu vaccine 01/2020    Depression screen [facility] 03/27/2020 01/16/2020 12/20/2019  Decreased Interest 2 0 1  Down, Depressed, Hopeless 1 0 1  PHQ - 2 Score 3 0 2  Altered sleeping 3 1 0  Tired, decreased energy 1 0 0  Change in appetite 0  1 1  Feeling bad or failure about yourself  1 0 1  Trouble concentrating 2 0 0  Moving slowly or fidgety/restless 0 0 0  Suicidal thoughts 0 0 0  PHQ-9 Score 10 2 4   Difficult doing work/chores Not difficult at all Not difficult at all Not difficult at all    Past Medical History:  Diagnosis Date   Anxiety    Asthma    Past Surgical History:  Procedure Laterality Date   POLYPECTOMY     TONSILLECTOMY     WISDOM TOOTH EXTRACTION     Social History   Socioeconomic History   Marital status: Single    Spouse name: Not on file   Number of children: Not on file   Years of education: Not on file   Highest education level: Not on file  Occupational History   Occupation: Pending interview  Tobacco Use   Smoking status: Never Smoker   Smokeless tobacco: Never Used  Vaping Use   Vaping Use: Never used  Substance and Sexual Activity   Alcohol use: Yes   Drug use: No   Sexual activity: Never  Other Topics Concern   Not on file  Social History Narrative   Not on file   Social Determinants of Health   Financial Resource Strain:    Difficulty of Paying Living Expenses: Not on file  Food Insecurity:    Worried About Running Out of Food in  the Last Year: Not on file   Ran Out of Food in the Last Year: Not on file  Transportation Needs:    Lack of Transportation (Medical): Not on file   Lack of Transportation (Non-Medical): Not on file  Physical Activity:    Days of Exercise per Week: Not on file   Minutes of Exercise per Session: Not on file  Stress:    Feeling of Stress : Not on file  Social Connections:    Frequency of Communication with Friends and Family: Not on file   Frequency of Social Gatherings with Friends and Family: Not on file   Attends Religious Services: Not on file   Active Member of Clubs or Organizations: Not on file   Attends Banker Meetings: Not on file   Marital Status: Not on file  Intimate Partner  Violence:    Fear of Current or Ex-Partner: Not on file   Emotionally Abused: Not on file   Physically Abused: Not on file   Sexually Abused: Not on file   Family History  Problem Relation Age of Onset   Depression Mother    Depression Father    Mental illness Maternal Grandfather    Current Outpatient Medications on File Prior to Visit  Medication Sig   albuterol (PROVENTIL HFA;VENTOLIN HFA) 108 (90 Base) MCG/ACT inhaler Inhale 2 puffs into the lungs every 4 (four) hours as needed for wheezing or shortness of breath (cough).   cetirizine (ZYRTEC) 10 MG tablet Take 1 tablet (10 mg total) by mouth daily.   fluticasone (FLONASE) 50 MCG/ACT nasal spray Place 2 sprays into both nostrils daily.   omega-3 fish oil (MAXEPA) 1000 MG CAPS capsule Take 2,000 mg by mouth daily with breakfast.   sertraline (ZOLOFT) 25 MG tablet Take 6 tablets (150 mg total) by mouth daily.   No current facility-administered medications on file prior to visit.    Review of Systems  Constitutional: Negative for activity change, appetite change, chills, diaphoresis, fatigue and fever.  HENT: Negative for congestion and hearing loss.   Eyes: Negative for visual disturbance.  Respiratory: Negative for cough, chest tightness, shortness of breath and wheezing.   Cardiovascular: Negative for chest pain, palpitations and leg swelling.  Gastrointestinal: Negative for abdominal pain, constipation, diarrhea, nausea and vomiting.  Genitourinary: Negative for dysuria, frequency and hematuria.  Musculoskeletal: Negative for arthralgias and neck pain.  Skin: Negative for rash.  Allergic/Immunologic: Negative for environmental allergies.  Neurological: Negative for dizziness, weakness, light-headedness, numbness and headaches.  Hematological: Negative for adenopathy.  Psychiatric/Behavioral: Negative for behavioral problems, dysphoric mood and sleep disturbance.   Per HPI unless specifically indicated  above      Objective:    BP 124/68    Pulse 65    Temp (!) 97.5 F (36.4 C) (Temporal)    Resp 16    Ht 5\' 10"  (1.778 m)    Wt 219 lb 12.8 oz (99.7 kg)    SpO2 99%    BMI 31.54 kg/m   Wt Readings from Last 3 Encounters:  03/27/20 219 lb 12.8 oz (99.7 kg)  12/12/19 210 lb 1.6 oz (95.3 kg)  03/20/19 210 lb (95.3 kg)    Physical Exam Vitals and nursing note reviewed.  Constitutional:      General: He is not in acute distress.    Appearance: He is well-developed. He is not diaphoretic.     Comments: Well-appearing, comfortable, cooperative  HENT:     Head: Normocephalic and atraumatic.  Eyes:  General:        Right eye: No discharge.        Left eye: No discharge.     Conjunctiva/sclera: Conjunctivae normal.     Pupils: Pupils are equal, round, and reactive to light.  Neck:     Thyroid: No thyromegaly.  Cardiovascular:     Rate and Rhythm: Normal rate and regular rhythm.     Heart sounds: Normal heart sounds. No murmur heard.   Pulmonary:     Effort: Pulmonary effort is normal. No respiratory distress.     Breath sounds: Normal breath sounds. No wheezing or rales.  Abdominal:     General: Bowel sounds are normal. There is no distension.     Palpations: Abdomen is soft. There is no mass.     Tenderness: There is no abdominal tenderness.  Musculoskeletal:        General: No tenderness. Normal range of motion.     Cervical back: Normal range of motion and neck supple.     Comments: Upper / Lower Extremities: - Normal muscle tone, strength bilateral upper extremities 5/5, lower extremities 5/5  Lymphadenopathy:     Cervical: No cervical adenopathy.  Skin:    General: Skin is warm and dry.     Findings: No erythema or rash.  Neurological:     Mental Status: He is alert and oriented to person, place, and time.     Comments: Distal sensation intact to light touch all extremities  Psychiatric:        Behavior: Behavior normal.     Comments: Well groomed, good eye  contact, normal speech and thoughts        Results for orders placed or performed in visit on 03/15/20  Hepatitis C antibody  Result Value Ref Range   Hepatitis C Ab NON-REACTIVE NON-REACTI   SIGNAL TO CUT-OFF 0.01 <1.00  HIV Antibody (routine testing w rflx)  Result Value Ref Range   HIV 1&2 Ab, 4th Generation NON-REACTIVE NON-REACTI  TSH  Result Value Ref Range   TSH 2.51 0.40 - 4.50 mIU/L  Lipid panel  Result Value Ref Range   Cholesterol 208 (H) <200 mg/dL   HDL 39 (L) > OR = 40 mg/dL   Triglycerides 782 (H) <150 mg/dL   LDL Cholesterol (Calc)  mg/dL (calc)   Total CHOL/HDL Ratio 5.3 (H) <5.0 (calc)   Non-HDL Cholesterol (Calc) 169 (H) <130 mg/dL (calc)  COMPLETE METABOLIC PANEL WITH GFR  Result Value Ref Range   Glucose, Bld 93 65 - 99 mg/dL   BUN 15 7 - 25 mg/dL   Creat 9.56 2.13 - 0.86 mg/dL   GFR, Est Non African American 119 > OR = 60 mL/min/1.36m2   GFR, Est African American 138 > OR = 60 mL/min/1.16m2   BUN/Creatinine Ratio NOT APPLICABLE 6 - 22 (calc)   Sodium 139 135 - 146 mmol/L   Potassium 3.7 3.5 - 5.3 mmol/L   Chloride 101 98 - 110 mmol/L   CO2 29 20 - 32 mmol/L   Calcium 9.7 8.6 - 10.3 mg/dL   Total Protein 6.8 6.1 - 8.1 g/dL   Albumin 4.8 3.6 - 5.1 g/dL   Globulin 2.0 1.9 - 3.7 g/dL (calc)   AG Ratio 2.4 1.0 - 2.5 (calc)   Total Bilirubin 0.9 0.2 - 1.2 mg/dL   Alkaline phosphatase (APISO) 47 36 - 130 U/L   AST 19 10 - 40 U/L   ALT 20 9 - 46 U/L  CBC with  Differential/Platelet  Result Value Ref Range   WBC 4.7 3.8 - 10.8 Thousand/uL   RBC 4.70 4.20 - 5.80 Million/uL   Hemoglobin 15.4 13.2 - 17.1 g/dL   HCT 93.2 38 - 50 %   MCV 95.5 80.0 - 100.0 fL   MCH 32.8 27.0 - 33.0 pg   MCHC 34.3 32.0 - 36.0 g/dL   RDW 35.5 73.2 - 20.2 %   Platelets 190 140 - 400 Thousand/uL   MPV 10.6 7.5 - 12.5 fL   Neutro Abs 2,773 1,500 - 7,800 cells/uL   Lymphs Abs 1,377 850 - 3,900 cells/uL   Absolute Monocytes 353 200 - 950 cells/uL   Eosinophils Absolute 169  15.0 - 500.0 cells/uL   Basophils Absolute 28 0.0 - 200.0 cells/uL   Neutrophils Relative % 59 %   Total Lymphocyte 29.3 %   Monocytes Relative 7.5 %   Eosinophils Relative 3.6 %   Basophils Relative 0.6 %  Hemoglobin A1c  Result Value Ref Range   Hgb A1c MFr Bld 4.5 <5.7 % of total Hgb   Mean Plasma Glucose 82 (calc)   eAG (mmol/L) 4.6 (calc)      Assessment & Plan:   Problem List Items Addressed This Visit    Shift work sleep disorder   Mixed hyperlipidemia   Major depressive disorder, recurrent episode, in partial remission (HCC)    Other Visit Diagnoses    Annual physical exam    -  Primary   Obesity (BMI 30.0-34.9)         Updated Health Maintenance information Reviewed recent lab results with patient Encouraged improvement to lifestyle with diet and exercise - Goal of weight loss  #Hyperlipidemia Elevated TG >400 Concerning for likely recent poor diet lifestyle contributing, even if fasting lab. Normal prior results unlikely genetic component Already on fish oil low dose Discussed major improvement on diet lifestyle and repeat lab in 3 month, defer medication, we can continue current fish oil, gave parameters for dose increase max dose OTC fish oil 2000mg  BID with meal in future if need.   #Major depression recurrent, shift work sleep disorder Previously managed by Psychiatry, had therapy and substance counseling Now he is doing well on current dose SSRI zoloft 150mg  daily, has plenty of medicine, off other meds. I encouraged him to return to [facility] and contact them about future goals / plans management, and if he prefers to return to me as PCP to manage his SSRI if no further complications with mood or substance issues, then we can manage this going forward and return to Psych if any acute complications, he will close the loop with Psychiatry, notify [employer] if need refill - Goal for him to give more time off 3rd shift to maintain sleep cycle and reset, for now keep  melatonin, and goal to keep working on this, we will hold any sleeping med for now, in past did well on mirtazapine but he prefers off med.    No orders of the defined types were placed in this encounter.   Follow up plan: Return in about 3 months (around 06/27/2020) for 3 month fasting lipid panel then follow-up lab result in person or virtual mychart.  Future lab lipid panel fasting 3 month [place], DO [facility] 03/27/2020, 4:44 PM

## 2020-03-28 ENCOUNTER — Other Ambulatory Visit: Payer: Self-pay | Admitting: Family Medicine

## 2020-03-28 DIAGNOSIS — E782 Mixed hyperlipidemia: Secondary | ICD-10-CM

## 2020-04-18 ENCOUNTER — Other Ambulatory Visit: Payer: Self-pay | Admitting: Family Medicine

## 2020-04-18 DIAGNOSIS — F3341 Major depressive disorder, recurrent, in partial remission: Secondary | ICD-10-CM

## 2020-04-18 DIAGNOSIS — F419 Anxiety disorder, unspecified: Secondary | ICD-10-CM

## 2020-05-30 ENCOUNTER — Other Ambulatory Visit: Payer: Self-pay | Admitting: Physician Assistant

## 2020-05-30 ENCOUNTER — Ambulatory Visit
Admission: RE | Admit: 2020-05-30 | Discharge: 2020-05-30 | Disposition: A | Discharge status: Home / Self Care | Payer: No Typology Code available for payment source | Source: Ambulatory Visit | Attending: Physician Assistant | Admitting: Physician Assistant

## 2020-05-30 DIAGNOSIS — Z111 Encounter for screening for respiratory tuberculosis: Secondary | ICD-10-CM

## 2020-06-17 DIAGNOSIS — F3341 Major depressive disorder, recurrent, in partial remission: Secondary | ICD-10-CM

## 2020-06-19 MED ORDER — SERTRALINE HCL 50 MG PO TABS: 150.0000 mg | ORAL_TABLET | Freq: Every day | ORAL | 0 refills | Status: DC

## 2020-06-19 MED ORDER — ARIPIPRAZOLE 5 MG PO TABS: 5.0000 mg | ORAL_TABLET | Freq: Every day | ORAL | 0 refills | Status: DC

## 2020-06-19 NOTE — Addendum Note (Signed)
Addended by: Smitty Cords on: 06/19/2020 05:07 PM   Modules accepted: Orders

## 2020-08-29 DIAGNOSIS — F3341 Major depressive disorder, recurrent, in partial remission: Secondary | ICD-10-CM

## 2020-08-29 DIAGNOSIS — F5104 Psychophysiologic insomnia: Secondary | ICD-10-CM

## 2020-08-29 MED ORDER — TRAZODONE HCL 50 MG PO TABS: 50.0000 mg | ORAL_TABLET | Freq: Every day | ORAL | 3 refills | Status: DC

## 2020-08-29 NOTE — Addendum Note (Signed)
Addended by: Smitty Cords on: 08/29/2020 02:26 PM   Modules accepted: Orders

## 2020-09-16 ENCOUNTER — Other Ambulatory Visit: Payer: Self-pay | Admitting: Family Medicine

## 2020-09-16 DIAGNOSIS — F3341 Major depressive disorder, recurrent, in partial remission: Secondary | ICD-10-CM

## 2020-09-16 NOTE — Telephone Encounter (Signed)
Requested medication (s) are due for refill today: yes  Requested medication (s) are on the active medication list: yes  Last refill: 06/19/20  Future visit scheduled: no  Notes to clinic: not delegated   Requested Prescriptions  Pending Prescriptions Disp Refills   ARIPiprazole (ABILIFY) 5 MG tablet [Pharmacy Med Name: ARIPIPRAZOLE 5 MG TABLET] 90 tablet 0    Sig: TAKE 1 TABLET (5 MG TOTAL) BY MOUTH DAILY.      Not Delegated - Psychiatry:  Antipsychotics - Second Generation (Atypical) - aripiprazole Failed - 09/16/2020  1:39 AM      Failed - This refill cannot be delegated      Passed - Valid encounter within last 6 months    Recent Outpatient Visits           5 months ago Annual physical exam   [facility], Netta Neat, DO   8 months ago Major depressive disorder, recurrent episode, in partial remission [facility])   [facility], Netta Neat, DO   9 months ago Alcohol dependence with alcohol-induced mood disorder [facility])   [facility] Smitty Cords, DO   1 year ago Annual physical exam   [facility] Smitty Cords, DO   2 years ago Annual physical exam   [facility] Smitty Cords, DO                 Signed Prescriptions Disp Refills   sertraline (ZOLOFT) 50 MG tablet 270 tablet 0    Sig: TAKE 3 TABLETS BY MOUTH EVERY DAY      Psychiatry:  Antidepressants - SSRI Passed - 09/16/2020  1:39 AM      Passed - Completed PHQ-2 or PHQ-9 in the last 360 days      Passed - Valid encounter within last 6 months    Recent Outpatient Visits           5 months ago Annual physical exam   [facility] [place], Netta Neat, DO   8 months ago Major depressive disorder, recurrent episode, in partial remission [facility])   [facility], Netta Neat, DO   9 months ago Alcohol dependence with alcohol-induced mood  disorder [facility])   [facility] Smitty Cords, DO   1 year ago Annual physical exam   [facility] Smitty Cords, DO   2 years ago Annual physical exam   [facility], Netta Neat, DO

## 2020-09-16 NOTE — Telephone Encounter (Signed)
Requested Prescriptions  Pending Prescriptions Disp Refills  . ARIPiprazole (ABILIFY) 5 MG tablet [Pharmacy Med Name: ARIPIPRAZOLE 5 MG TABLET] 90 tablet 0    Sig: TAKE 1 TABLET (5 MG TOTAL) BY MOUTH DAILY.     Not Delegated - Psychiatry:  Antipsychotics - Second Generation (Atypical) - aripiprazole Failed - 09/16/2020  1:39 AM      Failed - This refill cannot be delegated      Passed - Valid encounter within last 6 months    Recent Outpatient Visits          5 months ago Annual physical exam   [facility] [place], Netta Neat, DO   8 months ago Major depressive disorder, recurrent episode, in partial remission [facility])   [facility], Netta Neat, DO   9 months ago Alcohol dependence with alcohol-induced mood disorder [facility])   [facility] Smitty Cords, DO   1 year ago Annual physical exam   [facility] Smitty Cords, DO   2 years ago Annual physical exam   [facility], Netta Neat, DO             . sertraline (ZOLOFT) 50 MG tablet [Pharmacy Med Name: SERTRALINE HCL 50 MG TABLET] 270 tablet 0    Sig: TAKE 3 TABLETS BY MOUTH EVERY DAY     Psychiatry:  Antidepressants - SSRI Passed - 09/16/2020  1:39 AM      Passed - Completed PHQ-2 or PHQ-9 in the last 360 days      Passed - Valid encounter within last 6 months    Recent Outpatient Visits          5 months ago Annual physical exam   [facility] [place], Netta Neat, DO   8 months ago Major depressive disorder, recurrent episode, in partial remission [facility])   [facility], Netta Neat, DO   9 months ago Alcohol dependence with alcohol-induced mood disorder [facility])   [facility] Smitty Cords, DO   1 year ago Annual physical exam   [facility] Smitty Cords, DO   2 years ago Annual physical exam   [facility] [place], Netta Neat, DO

## 2020-09-20 ENCOUNTER — Other Ambulatory Visit: Payer: Self-pay | Admitting: Family Medicine

## 2020-09-20 DIAGNOSIS — F3341 Major depressive disorder, recurrent, in partial remission: Secondary | ICD-10-CM

## 2020-09-20 DIAGNOSIS — F5104 Psychophysiologic insomnia: Secondary | ICD-10-CM

## 2020-10-28 DIAGNOSIS — S92324A Nondisplaced fracture of second metatarsal bone, right foot, initial encounter for closed fracture: Secondary | ICD-10-CM | POA: Diagnosis not present

## 2020-12-21 ENCOUNTER — Other Ambulatory Visit: Payer: Self-pay | Admitting: Family Medicine

## 2020-12-21 DIAGNOSIS — F3341 Major depressive disorder, recurrent, in partial remission: Secondary | ICD-10-CM

## 2020-12-21 NOTE — Telephone Encounter (Signed)
Requested medications are due for refill today yes  Requested medications are on the active medication list yes  Last refill 5/4  Last visit 03/2020  Future visit scheduled no  Notes to clinic Not Delegated

## 2020-12-22 ENCOUNTER — Other Ambulatory Visit: Payer: Self-pay | Admitting: Family Medicine

## 2020-12-22 DIAGNOSIS — F3341 Major depressive disorder, recurrent, in partial remission: Secondary | ICD-10-CM

## 2020-12-22 NOTE — Telephone Encounter (Signed)
Alternative requested. Routing to office.

## 2020-12-23 ENCOUNTER — Other Ambulatory Visit: Payer: Self-pay | Admitting: Family Medicine

## 2020-12-23 DIAGNOSIS — F3341 Major depressive disorder, recurrent, in partial remission: Secondary | ICD-10-CM

## 2020-12-23 NOTE — Telephone Encounter (Signed)
Insurance is declining his Sertraline 50mg  x 3 = 150mg  daily.  Says max quantity is 1 per day.  This would likely require a Prior Auth for Quantity exception.  Or could consider 100mg  x 1.5 pill = 150mg , but that probably won't be covered either.  Could you work on this one? I am not sure if he is out or going to run out as well. May need to ask him how many he has left, he might have to do out of pocket for a month until we can get it approved.  , DO [facility] 12/23/2020, 1:17 PM

## 2020-12-28 ENCOUNTER — Other Ambulatory Visit: Payer: Self-pay | Admitting: Family Medicine

## 2020-12-28 DIAGNOSIS — F5104 Psychophysiologic insomnia: Secondary | ICD-10-CM

## 2020-12-28 DIAGNOSIS — F3341 Major depressive disorder, recurrent, in partial remission: Secondary | ICD-10-CM

## 2020-12-28 NOTE — Telephone Encounter (Signed)
Requested medications are due for refill today yes  Requested medications are on the active medication list yes  Last refill 7/11  Last visit 03/2020  Future visit scheduled no  Notes to clinic Failed protocol due to no valid visit within 6  months, no upcoming visit scheduled.

## 2021-01-02 ENCOUNTER — Encounter: Payer: Self-pay | Admitting: Family Medicine

## 2021-01-06 ENCOUNTER — Encounter: Payer: Self-pay | Admitting: Family Medicine

## 2021-01-06 ENCOUNTER — Telehealth: Payer: BC Managed Care – PPO | Admitting: Family Medicine

## 2021-01-06 VITALS — Ht 71.0 in | Wt 260.0 lb

## 2021-01-06 DIAGNOSIS — F3341 Major depressive disorder, recurrent, in partial remission: Secondary | ICD-10-CM | POA: Diagnosis not present

## 2021-01-06 DIAGNOSIS — J310 Chronic rhinitis: Secondary | ICD-10-CM | POA: Diagnosis not present

## 2021-01-06 DIAGNOSIS — J0110 Acute frontal sinusitis, unspecified: Secondary | ICD-10-CM

## 2021-01-06 DIAGNOSIS — F5104 Psychophysiologic insomnia: Secondary | ICD-10-CM | POA: Diagnosis not present

## 2021-01-06 DIAGNOSIS — T485X5A Adverse effect of other anti-common-cold drugs, initial encounter: Secondary | ICD-10-CM

## 2021-01-06 MED ORDER — AMOXICILLIN-POT CLAVULANATE 875-125 MG PO TABS: 1.0000 | ORAL_TABLET | Freq: Two times a day (BID) | ORAL | 0 refills | Status: DC

## 2021-01-06 NOTE — Progress Notes (Signed)
Subjective:    Patient ID: Joshua Kinds., male    DOB: [birth year], 25 y.o.   MRN: [id number]  Joshua Edwards. is a 25 y.o. male presenting on 01/06/2021 for Sinus Problem  Virtual / Telehealth Encounter - Video Visit via MyChart The purpose of this virtual visit is to provide medical care while limiting exposure to the novel coronavirus (COVID19) for both patient and office staff.  Consent was obtained for remote visit:  Yes.   Answered questions that patient had about telehealth interaction:  Yes.   I discussed the limitations, risks, security and privacy concerns of performing an evaluation and management service by video/telephone. I also discussed with the patient that there may be a patient responsible charge related to this service. The patient expressed understanding and agreed to proceed.  Patient Location: Home Provider Location: [facility] (Office)  Participants in virtual visit: - Patient: Joshua Edwards" Darl Pikes - CMA: Burnell Blanks, CMA - Provider: Dr Althea Charon   HPI  Recurrent major depression in partial remission He was managed on Zoloft 150mg  daily and doing well however insurance requires only 1 pill per day, therefore not covering 3 pills per day. Needs new order. Takes Abilify 5mg  daily Trazodone 50mg  Adjusting to day shift  Sinusitis / Allergic Rhinosinusitis / Rhinitis Medicamentosa  Worsening sinus congestion pressure He is using oxymetazoline nasal spray 2-3 times a day for few weeks Tried Netti Pot, Allergy meds   Depression screen [facility] 2/9 01/06/2021 03/27/2020 01/16/2020  Decreased Interest 1 2 0  Down, Depressed, Hopeless 1 1 0  PHQ - 2 Score 2 3 0  Altered sleeping 2 3 1   Tired, decreased energy 1 1 0  Change in appetite 0 0 1  Feeling bad or failure about yourself  0 1 0  Trouble concentrating 1 2 0  Moving slowly or fidgety/restless 0 0 0  Suicidal thoughts 0 0 0  PHQ-9 Score 6 10 2   Difficult doing  work/chores Somewhat difficult Not difficult at all Not difficult at all  Some recent data might be hidden    Social History   Tobacco Use   Smoking status: Never   Smokeless tobacco: Never  Vaping Use   Vaping Use: Never used  Substance Use Topics   Alcohol use: Yes   Drug use: No    Review of Systems Per HPI unless specifically indicated above     Objective:    Ht 5\' 11"  (1.803 m)   Wt 260 lb (117.9 kg)   BMI 36.26 kg/m   Wt Readings from Last 3 Encounters:  01/06/21 260 lb (117.9 kg)  03/27/20 219 lb 12.8 oz (99.7 kg)  12/12/19 210 lb 1.6 oz (95.3 kg)    Physical Exam  Note examination was completely remotely via video observation objective data only  Gen - well-appearing, no acute distress or apparent pain, comfortable HEENT - eyes appear clear without discharge or redness Heart/Lungs - cannot examine virtually - observed no evidence of coughing or labored breathing. Abd - cannot examine virtually  Skin - face visible today- no rash Neuro - awake, alert, oriented Psych - not anxious appearing   Results for orders placed or performed in visit on 03/15/20  Hepatitis C antibody  Result Value Ref Range   Hepatitis C Ab NON-REACTIVE NON-REACTI   SIGNAL TO CUT-OFF 0.01 <1.00  HIV Antibody (routine testing w rflx)  Result Value Ref Range   HIV 1&2 Ab, 4th Generation NON-REACTIVE NON-REACTI  TSH  Result Value Ref Range   TSH 2.51 0.40 - 4.50 mIU/L  Lipid panel  Result Value Ref Range   Cholesterol 208 (H) <200 mg/dL   HDL 39 (L) > OR = 40 mg/dL   Triglycerides 161 (H) <150 mg/dL   LDL Cholesterol (Calc)  mg/dL (calc)   Total CHOL/HDL Ratio 5.3 (H) <5.0 (calc)   Non-HDL Cholesterol (Calc) 169 (H) <130 mg/dL (calc)  COMPLETE METABOLIC PANEL WITH GFR  Result Value Ref Range   Glucose, Bld 93 65 - 99 mg/dL   BUN 15 7 - 25 mg/dL   Creat 0.96 0.45 - 4.09 mg/dL   GFR, Est Non African American 119 > OR = 60 mL/min/1.36m2   GFR, Est African American 138 > OR =  60 mL/min/1.80m2   BUN/Creatinine Ratio NOT APPLICABLE 6 - 22 (calc)   Sodium 139 135 - 146 mmol/L   Potassium 3.7 3.5 - 5.3 mmol/L   Chloride 101 98 - 110 mmol/L   CO2 29 20 - 32 mmol/L   Calcium 9.7 8.6 - 10.3 mg/dL   Total Protein 6.8 6.1 - 8.1 g/dL   Albumin 4.8 3.6 - 5.1 g/dL   Globulin 2.0 1.9 - 3.7 g/dL (calc)   AG Ratio 2.4 1.0 - 2.5 (calc)   Total Bilirubin 0.9 0.2 - 1.2 mg/dL   Alkaline phosphatase (APISO) 47 36 - 130 U/L   AST 19 10 - 40 U/L   ALT 20 9 - 46 U/L  CBC with Differential/Platelet  Result Value Ref Range   WBC 4.7 3.8 - 10.8 Thousand/uL   RBC 4.70 4.20 - 5.80 Million/uL   Hemoglobin 15.4 13.2 - 17.1 g/dL   HCT 81.1 91.4 - 78.2 %   MCV 95.5 80.0 - 100.0 fL   MCH 32.8 27.0 - 33.0 pg   MCHC 34.3 32.0 - 36.0 g/dL   RDW 95.6 21.3 - 08.6 %   Platelets 190 140 - 400 Thousand/uL   MPV 10.6 7.5 - 12.5 fL   Neutro Abs 2,773 1,500 - 7,800 cells/uL   Lymphs Abs 1,377 850 - 3,900 cells/uL   Absolute Monocytes 353 200 - 950 cells/uL   Eosinophils Absolute 169 15 - 500 cells/uL   Basophils Absolute 28 0 - 200 cells/uL   Neutrophils Relative % 59 %   Total Lymphocyte 29.3 %   Monocytes Relative 7.5 %   Eosinophils Relative 3.6 %   Basophils Relative 0.6 %  Hemoglobin A1c  Result Value Ref Range   Hgb A1c MFr Bld 4.5 <5.7 % of total Hgb   Mean Plasma Glucose 82 (calc)   eAG (mmol/L) 4.6 (calc)      Assessment & Plan:   Problem List Items Addressed This Visit     Psychophysiological insomnia   Relevant Medications   sertraline (ZOLOFT) 100 MG tablet   Major depressive disorder, recurrent episode, in partial remission (HCC) - Primary   Relevant Medications   sertraline (ZOLOFT) 100 MG tablet   Other Visit Diagnoses     Acute non-recurrent frontal sinusitis       Relevant Medications   amoxicillin-clavulanate (AUGMENTIN) 875-125 MG tablet   Rhinitis medicamentosa           Acute rhinosinusitis Likely primary symptoms from rhinitis medicamentosa OTC  oxymetazoline usage 2-3 times a day for few weeks May have started / developed sinus infection now Will cover with augmentin DC OTC oxymetazoline, taper off may use 1 x at night only then phase off Use allergy  meds flonase continue  Depression recurrent partial remission Insomnia Adjust sertraline from 50mg  x 3 = 150mg  daily down to 100mg  daily now, use current 50mg  tabs for 1 month contact when ready for new rx 100mg  sent in    Meds ordered this encounter  Medications   amoxicillin-clavulanate (AUGMENTIN) 875-125 MG tablet    Sig: Take 1 tablet by mouth 2 (two) times daily.    Dispense:  20 tablet    Refill:  0      Follow up plan: Return if symptoms worsen or fail to improve.   Patient verbalizes understanding with the above medical recommendations including the limitation of remote medical advice.  Specific follow-up and call-back criteria were given for patient to follow-up or seek medical care more urgently if needed.  Total duration of direct patient care provided via video conference: 10 minutes   , DO [facility] 01/06/2021, 4:41 PM

## 2021-01-06 NOTE — Patient Instructions (Addendum)
   Please schedule a Follow-up Appointment to: Return if symptoms worsen or fail to improve.  If you have any other questions or concerns, please feel free to call the office or send a message through MyChart. You may also schedule an earlier appointment if necessary.  Additionally, you may be receiving a survey about your experience at our office within a few days to 1 week by e-mail or mail. We value your feedback.  Lendora Keys, DO [facility], [facility] 

## 2021-01-28 ENCOUNTER — Other Ambulatory Visit: Payer: Self-pay | Admitting: Family Medicine

## 2021-01-28 DIAGNOSIS — F5104 Psychophysiologic insomnia: Secondary | ICD-10-CM

## 2021-01-28 DIAGNOSIS — F3341 Major depressive disorder, recurrent, in partial remission: Secondary | ICD-10-CM

## 2021-01-29 NOTE — Telephone Encounter (Signed)
Requested medication (s) are due for refill today: No  Requested medication (s) are on the active medication list: Yes  Last refill:  12/30/20  Future visit scheduled: No  Notes to clinic:  Pharmacy requests 90 day supply and diagnosis code.    Requested Prescriptions  Pending Prescriptions Disp Refills   traZODone (DESYREL) 50 MG tablet [Pharmacy Med Name: TRAZODONE 50 MG TABLET] 90 tablet 1    Sig: TAKE 1 TABLET BY MOUTH EVERYDAY AT BEDTIME     Psychiatry: Antidepressants - Serotonin Modulator Passed - 01/28/2021 10:29 PM      Passed - Completed PHQ-2 or PHQ-9 in the last 360 days      Passed - Valid encounter within last 6 months    Recent Outpatient Visits           3 weeks ago Major depressive disorder, recurrent episode, in partial remission [facility])   [facility], Netta Neat, DO   10 months ago Annual physical exam   [facility] Smitty Cords, DO   1 year ago Major depressive disorder, recurrent episode, in partial remission [facility])   [facility] Smitty Cords, DO   1 year ago Alcohol dependence with alcohol-induced mood disorder [facility])   [facility] Smitty Cords, DO   1 year ago Annual physical exam   [facility], Netta Neat, DO

## 2022-04-03 ENCOUNTER — Encounter: Payer: Self-pay | Admitting: Family Medicine

## 2022-04-03 ENCOUNTER — Ambulatory Visit: Payer: Self-pay | Admitting: Family Medicine

## 2022-04-03 VITALS — BP 134/68 | HR 75 | Ht 70.0 in | Wt 224.0 lb

## 2022-04-03 DIAGNOSIS — F1020 Alcohol dependence, uncomplicated: Secondary | ICD-10-CM

## 2022-04-03 DIAGNOSIS — F3341 Major depressive disorder, recurrent, in partial remission: Secondary | ICD-10-CM

## 2022-04-03 DIAGNOSIS — Z9852 Vasectomy status: Secondary | ICD-10-CM

## 2022-04-03 MED ORDER — NALTREXONE HCL 50 MG PO TABS: 50.0000 mg | ORAL_TABLET | Freq: Every day | ORAL | 2 refills | Status: DC

## 2022-04-03 MED ORDER — GABAPENTIN 300 MG PO CAPS: ORAL_CAPSULE | ORAL | 0 refills | Status: DC

## 2022-04-03 NOTE — Patient Instructions (Addendum)
Thank you for coming to the office today.  Start Naltrexone 50mg  daily for reducing alcohol craving and pleasure from drinking. You can still drink while on this, goal is to pick a quit date and taper down.  For our Outpatient Alcohol Detox Plan take the following meds:  Gabapentin (take every day to reduce withdrawal symptoms at baseline)  ?Day 1 - 300 mg every 6 hours ?Day 2 - 300 mg every 8 hours ?Day 3 - 300 mg every 12 hours ?Day 4-7 - 300 mg one dose ----------------------------------------------------------------  DID NOT ORDER THIS IF you experience more severe alcohol withdrawal symptoms we would offer this next time Oxazepam (take ONLY AS NEEDED for symptoms - tremors, elevated heart rate, sweats/temperature changes, agitation)  Day 1-2 - 30 mg every 6 hours as needed Days 3 to 7- 15 mg every 6 hours as needed   Vitamins - B1 (Thiamine) and Folic Acid, and B12 - daily dosing - can do a Multivitamin with most of these as well.  If worsening symptoms despite medicine, uncontrolled tremors, fever, chills sweats, confusion, hallucinations, seizure - emergency call 911, may need hospital detox inpatient  PSYCHIATRY / THERAPY-COUSENLING  Self referral for substance program if need  [facility]  Addiction treatment center in [place], [facility]  Address: [address] [place], [place], [place]  Phone: [phone]  [phone], available walk-in 9am-4pm M-F [address] [place], [place] Hours: 9am - 4pm (M-F, walk in available) [phone]  Alcoholics Anonymous (AA)  [place] Web Site: [place] Answering Service: [phone]  ---------------------------------------------------------------------  Basic Semen Analysis is a sperm test to determine if sperm are present and viable. This can be used as an initial fertility test for men.  If you would like to check the cost and coverage of the test,  you may contact your insurance provider to determine your cost. If you would like to proceed, then notify our office to proceed with the order.  [facility]  - Information to give to insurance to determine cost: - "Semen Analysis, Basic" - Test Code/#: [phone] - CPT Code: R5956127  - To proceed with Semen Analysis testing please follow these instructions: - You will need the following items from our office:    1) Sterile specimen cup / container, within a specimen bag    2) Collection Instructions Form - from [facility]    3) Released order from our office, printed  - The test must be scheduled at least 1 week in advance. They can schedule you weeks or months in advance. - First, read the "Collection Instructions" closely - ask any questions you have when you call to schedule - You need to schedule your specimen drop off appointment with [facility]: Call [phone] (Semen Analysis Scheduling)     (Only 1 [facility] office locally [phone], [place]) offers this test. If you are outside area, [facility] can locate a different office for you) - Pick your drop off date and time. They offer AM and Afternoon times. The specimen will need to be collected and then dropped off within 1 hour of collection following their specific instructions. - You should be abstinent / without ejaculation by any means at least 2 to 7 days prior to the specimen collection - On the day of your collection, before you collect your sample, complete the "bottom half of the Information Form" and bring this with your specimen for drop off  Additionally, if further questions regarding interpretation of  semen analysis remain, we may consult Urology Specialist. Or if additional fertility testing is required, we will often assist in referring to a Fertility Clinic.    Please schedule a Follow-up Appointment to: Return in about 4 weeks (around 05/01/2022), or if symptoms worsen or fail to improve, for 2-4 weeks follow-up alcohol detox.  If  you have any other questions or concerns, please feel free to call the office or send a message through MyChart. You may also schedule an earlier appointment if necessary.  Additionally, you may be receiving a survey about your experience at our office within a few days to 1 week by e-mail or mail. We value your feedback.  Saralyn Pilar, DO [facility], [place]

## 2022-04-03 NOTE — Progress Notes (Addendum)
Subjective:    Patient ID: Joshua Kinds., male    DOB: 1995/10/26, 26 y.o.   MRN: [id number]  Joshua Edwards. is a 26 y.o. male presenting on 04/03/2022 for Alcohol Problem   HPI  Lack of insurance for period of time, weaned off medications  Here today back with insurance ready to treat his alcohol dependence  Alcohol Dependence w history of mood disturbance Currently still active alcohol consumption Requesting medication such as naltrexone he had been on before Previous detox in past with temporary results He has no comorbid mood symptoms He is ready to pursue medication  Vasectomy done 2021 He requests to double check semen analysis to confirm infertility   Asks about Future Autism spectrum testing      04/03/2022   10:05 AM 01/06/2021    4:52 PM 03/27/2020    5:01 PM  Depression screen PHQ 2/9  Decreased Interest 2 1 2   Down, Depressed, Hopeless 0 1 1  PHQ - 2 Score 2 2 3   Altered sleeping 0 2 3  Tired, decreased energy 1 1 1   Change in appetite 0 0 0  Feeling bad or failure about yourself  0 0 1  Trouble concentrating 0 1 2  Moving slowly or fidgety/restless 0 0 0  Suicidal thoughts 1 0 0  PHQ-9 Score 4 6 10   Difficult doing work/chores Not difficult at all Somewhat difficult Not difficult at all      04/03/2022   10:05 AM 01/16/2020    9:40 AM 12/20/2019    3:10 PM 03/17/2018    9:22 AM  GAD 7 : Generalized Anxiety Score  Nervous, Anxious, on Edge 0 0 0 0  Control/stop worrying 0 0 1 0  Worry too much - different things 1 1 1 1   Trouble relaxing 0 0 0 0  Restless 0 0 0 0  Easily annoyed or irritable 1 0 1 1  Afraid - awful might happen 0 0 0 0  Total GAD 7 Score 2 1 3 2   Anxiety Difficulty Not difficult at all Not difficult at all Not difficult at all Not difficult at all     Social History   Tobacco Use   Smoking status: Never   Smokeless tobacco: Never  Vaping Use   Vaping Use: Never used  Substance Use Topics   Alcohol  use: Yes    Alcohol/week: 3.0 standard drinks of alcohol    Types: 3 Standard drinks or equivalent per week    Comment: three times a week, gin   Drug use: No    Review of Systems Per HPI unless specifically indicated above     Objective:    BP 134/68 (BP Location: Left Arm, Cuff Size: Normal)   Pulse 75   Ht 5\' 10"  (1.778 m)   Wt 224 lb (101.6 kg)   SpO2 99%   BMI 32.14 kg/m   Wt Readings from Last 3 Encounters:  04/03/22 224 lb (101.6 kg)  01/06/21 260 lb (117.9 kg)  03/27/20 219 lb 12.8 oz (99.7 kg)    Physical Exam Vitals and nursing note reviewed.  Constitutional:      General: He is not in acute distress.    Appearance: Normal appearance. He is well-developed. He is not diaphoretic.     Comments: Well-appearing, comfortable, cooperative  HENT:     Head: Normocephalic and atraumatic.  Eyes:     General:        Right eye: No discharge.  Left eye: No discharge.     Conjunctiva/sclera: Conjunctivae normal.  Cardiovascular:     Rate and Rhythm: Normal rate.  Pulmonary:     Effort: Pulmonary effort is normal.  Skin:    General: Skin is warm and dry.     Findings: No erythema or rash.  Neurological:     Mental Status: He is alert and oriented to person, place, and time.  Psychiatric:        Mood and Affect: Mood normal.        Behavior: Behavior normal.        Thought Content: Thought content normal.     Comments: Well groomed, good eye contact, normal speech and thoughts      Results for orders placed or performed in visit on 03/15/20  Hepatitis C antibody  Result Value Ref Range   Hepatitis C Ab NON-REACTIVE NON-REACTI   SIGNAL TO CUT-OFF 0.01 <1.00  HIV Antibody (routine testing w rflx)  Result Value Ref Range   HIV 1&2 Ab, 4th Generation NON-REACTIVE NON-REACTI  TSH  Result Value Ref Range   TSH 2.51 0.40 - 4.50 mIU/L  Lipid panel  Result Value Ref Range   Cholesterol 208 (H) <200 mg/dL   HDL 39 (L) > OR = 40 mg/dL   Triglycerides 709  (H) <150 mg/dL   LDL Cholesterol (Calc)  mg/dL (calc)   Total CHOL/HDL Ratio 5.3 (H) <5.0 (calc)   Non-HDL Cholesterol (Calc) 169 (H) <130 mg/dL (calc)  COMPLETE METABOLIC PANEL WITH GFR  Result Value Ref Range   Glucose, Bld 93 65 - 99 mg/dL   BUN 15 7 - 25 mg/dL   Creat 6.28 3.66 - 2.94 mg/dL   GFR, Est Non African American 119 > OR = 60 mL/min/1.44m2   GFR, Est African American 138 > OR = 60 mL/min/1.79m2   BUN/Creatinine Ratio NOT APPLICABLE 6 - 22 (calc)   Sodium 139 135 - 146 mmol/L   Potassium 3.7 3.5 - 5.3 mmol/L   Chloride 101 98 - 110 mmol/L   CO2 29 20 - 32 mmol/L   Calcium 9.7 8.6 - 10.3 mg/dL   Total Protein 6.8 6.1 - 8.1 g/dL   Albumin 4.8 3.6 - 5.1 g/dL   Globulin 2.0 1.9 - 3.7 g/dL (calc)   AG Ratio 2.4 1.0 - 2.5 (calc)   Total Bilirubin 0.9 0.2 - 1.2 mg/dL   Alkaline phosphatase (APISO) 47 36 - 130 U/L   AST 19 10 - 40 U/L   ALT 20 9 - 46 U/L  CBC with Differential/Platelet  Result Value Ref Range   WBC 4.7 3.8 - 10.8 Thousand/uL   RBC 4.70 4.20 - 5.80 Million/uL   Hemoglobin 15.4 13.2 - 17.1 g/dL   HCT 76.5 46.5 - 03.5 %   MCV 95.5 80.0 - 100.0 fL   MCH 32.8 27.0 - 33.0 pg   MCHC 34.3 32.0 - 36.0 g/dL   RDW 46.5 68.1 - 27.5 %   Platelets 190 140 - 400 Thousand/uL   MPV 10.6 7.5 - 12.5 fL   Neutro Abs 2,773 1,500 - 7,800 cells/uL   Lymphs Abs 1,377 850 - 3,900 cells/uL   Absolute Monocytes 353 200 - 950 cells/uL   Eosinophils Absolute 169 15 - 500 cells/uL   Basophils Absolute 28 0 - 200 cells/uL   Neutrophils Relative % 59 %   Total Lymphocyte 29.3 %   Monocytes Relative 7.5 %   Eosinophils Relative 3.6 %   Basophils Relative 0.6 %  Hemoglobin A1c  Result Value Ref Range   Hgb A1c MFr Bld 4.5 <5.7 % of total Hgb   Mean Plasma Glucose 82 (calc)   eAG (mmol/L) 4.6 (calc)      Assessment & Plan:   Problem List Items Addressed This Visit     Alcohol dependence, uncomplicated (HCC) - Primary   Relevant Medications   naltrexone (DEPADE) 50 MG  tablet   gabapentin (NEURONTIN) 300 MG capsule   Major depressive disorder, recurrent episode, in partial remission (HCC)   Other Visit Diagnoses     History of vasectomy       Relevant Orders   Semen Analysis, Basic      Alcohol Dependence, uncomplicated Associated with mood disorder - now currently in remission without depression Persistent vs intermittent heavy drinking since age 57 Prior detox / rehab treatments unsuccessful only temporary  He is actively ready to quit alcohol and interested in medication options outpatient  He has not had complicated withdrawal symptoms  Plan: 1. Reviewed alcoholism briefly - risks and complications of chronic alcohol use, also reviewed risks of quitting abruptly, signs/symptoms of withdrawal, when to seek emergent treatment at hospital ED  Rx Naltrexone 50mg  daily for craving / quit Rx Gabapentin course 7 day for mitigating some detox symptoms Discussed but agreed to defer BDZ rx and if he has complication with detox he may follow back up to consider this or pursue more medical detox  Gabapentin (fixed dosing) ?Day 1 - 300 mg every 6 hours ?Day 2 - 300 mg every 8 hours ?Day 3 - 300 mg every 12 hours ?Day 4-7 - 300 mg one dose   #Hx Vasectomy Several years ago, patient requesting semen analysis test to confirm infertility AVS and info given to patient about scheduling and ordering.    Orders Placed This Encounter  Procedures   Semen Analysis, Basic     Meds ordered this encounter  Medications   naltrexone (DEPADE) 50 MG tablet    Sig: Take 1 tablet (50 mg total) by mouth daily.    Dispense:  30 tablet    Refill:  2   gabapentin (NEURONTIN) 300 MG capsule    Sig: Day 1 - 300 mg every 6 hours, Day 2 - 300 mg every 8 hours, Day 3 - 300 mg every 12 hours, Day 4-7 - 300 mg one dose    Dispense:  15 capsule    Refill:  0      Follow up plan: Return in about 4 weeks (around 05/01/2022), or if symptoms worsen or fail to  improve, for 2-4 weeks follow-up alcohol detox.   14/12/2021, DO [facility] 04/03/2022, 10:06 AM

## 2022-05-15 ENCOUNTER — Encounter: Payer: Self-pay | Admitting: Family Medicine

## 2022-05-15 ENCOUNTER — Ambulatory Visit (INDEPENDENT_AMBULATORY_CARE_PROVIDER_SITE_OTHER): Payer: BC Managed Care – PPO | Admitting: Family Medicine

## 2022-05-15 VITALS — BP 118/64 | HR 66 | Ht 70.0 in | Wt 224.0 lb

## 2022-05-15 DIAGNOSIS — F1020 Alcohol dependence, uncomplicated: Secondary | ICD-10-CM | POA: Diagnosis not present

## 2022-05-15 DIAGNOSIS — F3341 Major depressive disorder, recurrent, in partial remission: Secondary | ICD-10-CM | POA: Diagnosis not present

## 2022-05-15 MED ORDER — NALTREXONE HCL 50 MG PO TABS: 100.0000 mg | ORAL_TABLET | Freq: Every day | ORAL | 2 refills | Status: DC

## 2022-05-15 MED ORDER — DISULFIRAM 250 MG PO TABS: 250.0000 mg | ORAL_TABLET | Freq: Every day | ORAL | 2 refills | Status: DC

## 2022-05-15 NOTE — Progress Notes (Signed)
Subjective:    Patient ID: Joshua Edwards., male    DOB: 05/16/1996, 26 y.o.   MRN: [id number]  Joshua Edwards. is a 26 y.o. male presenting on 05/15/2022 for Depression   HPI  Alcohol Dependence w history of mood disturbance Last visit 04/03/22 treated for alcohol dependence with naltrexone 50mg , temporary cessation but resumed alcohol again.  Currently still active alcohol consumption Previous detox in past with temporary results He has no comorbid mood symptoms currently  He is interested in med adjustment or other assistance. Not interested in program or inpatient detox He has not experienced any complicated withdrawal symptoms.  Currently alcohol consumption - 2-3 times during the week and weekends, 375 mL gin     05/15/2022    9:33 AM 04/03/2022   10:05 AM 01/06/2021    4:52 PM  Depression screen PHQ 2/9  Decreased Interest 1 2 1   Down, Depressed, Hopeless 1 0 1  PHQ - 2 Score 2 2 2   Altered sleeping 1 0 2  Tired, decreased energy 1 1 1   Change in appetite 2 0 0  Feeling bad or failure about yourself  1 0 0  Trouble concentrating 0 0 1  Moving slowly or fidgety/restless 0 0 0  Suicidal thoughts 1 1 0  PHQ-9 Score 8 4 6   Difficult doing work/chores Somewhat difficult Not difficult at all Somewhat difficult      05/15/2022    9:34 AM 04/03/2022   10:05 AM 01/16/2020    9:40 AM 12/20/2019    3:10 PM  GAD 7 : Generalized Anxiety Score  Nervous, Anxious, on Edge 0 0 0 0  Control/stop worrying 0 0 0 1  Worry too much - different things 0 1 1 1   Trouble relaxing 1 0 0 0  Restless 0 0 0 0  Easily annoyed or irritable 1 1 0 1  Afraid - awful might happen 0 0 0 0  Total GAD 7 Score 2 2 1 3   Anxiety Difficulty Not difficult at all Not difficult at all Not difficult at all Not difficult at all      Social History   Tobacco Use   Smoking status: Never   Smokeless tobacco: Never  Vaping Use   Vaping Use: Never used  Substance Use Topics    Alcohol use: Yes    Alcohol/week: 3.0 standard drinks of alcohol    Types: 3 Standard drinks or equivalent per week    Comment: three times a week, gin   Drug use: No    Review of Systems Per HPI unless specifically indicated above     Objective:    BP 118/64   Pulse 66   Ht 5\' 10"  (1.778 m)   Wt 224 lb (101.6 kg)   SpO2 99%   BMI 32.14 kg/m   Wt Readings from Last 3 Encounters:  05/15/22 224 lb (101.6 kg)  04/03/22 224 lb (101.6 kg)  01/06/21 260 lb (117.9 kg)    Physical Exam Vitals and nursing note reviewed.  Constitutional:      General: He is not in acute distress.    Appearance: He is well-developed. He is not diaphoretic.     Comments: Well-appearing, comfortable, cooperative  HENT:     Head: Normocephalic and atraumatic.  Eyes:     General:        Right eye: No discharge.        Left eye: No discharge.     Conjunctiva/sclera: Conjunctivae  normal.  Neck:     Thyroid: No thyromegaly.  Cardiovascular:     Rate and Rhythm: Normal rate and regular rhythm.     Pulses: Normal pulses.     Heart sounds: Normal heart sounds. No murmur heard. Pulmonary:     Effort: Pulmonary effort is normal. No respiratory distress.     Breath sounds: Normal breath sounds. No wheezing or rales.  Musculoskeletal:        General: Normal range of motion.     Cervical back: Normal range of motion and neck supple.  Lymphadenopathy:     Cervical: No cervical adenopathy.  Skin:    General: Skin is warm and dry.     Findings: No erythema or rash.  Neurological:     Mental Status: He is alert and oriented to person, place, and time. Mental status is at baseline.  Psychiatric:        Behavior: Behavior normal.     Comments: Well groomed, good eye contact, normal speech and thoughts    Results for orders placed or performed in visit on 03/15/20  Hepatitis C antibody  Result Value Ref Range   Hepatitis C Ab NON-REACTIVE NON-REACTI   SIGNAL TO CUT-OFF 0.01 <1.00  HIV Antibody  (routine testing w rflx)  Result Value Ref Range   HIV 1&2 Ab, 4th Generation NON-REACTIVE NON-REACTI  TSH  Result Value Ref Range   TSH 2.51 0.40 - 4.50 mIU/L  Lipid panel  Result Value Ref Range   Cholesterol 208 (H) <200 mg/dL   HDL 39 (L) > OR = 40 mg/dL   Triglycerides 875 (H) <150 mg/dL   LDL Cholesterol (Calc)  mg/dL (calc)   Total CHOL/HDL Ratio 5.3 (H) <5.0 (calc)   Non-HDL Cholesterol (Calc) 169 (H) <130 mg/dL (calc)  COMPLETE METABOLIC PANEL WITH GFR  Result Value Ref Range   Glucose, Bld 93 65 - 99 mg/dL   BUN 15 7 - 25 mg/dL   Creat 6.43 3.29 - 5.18 mg/dL   GFR, Est Non African American 119 > OR = 60 mL/min/1.78m2   GFR, Est African American 138 > OR = 60 mL/min/1.89m2   BUN/Creatinine Ratio NOT APPLICABLE 6 - 22 (calc)   Sodium 139 135 - 146 mmol/L   Potassium 3.7 3.5 - 5.3 mmol/L   Chloride 101 98 - 110 mmol/L   CO2 29 20 - 32 mmol/L   Calcium 9.7 8.6 - 10.3 mg/dL   Total Protein 6.8 6.1 - 8.1 g/dL   Albumin 4.8 3.6 - 5.1 g/dL   Globulin 2.0 1.9 - 3.7 g/dL (calc)   AG Ratio 2.4 1.0 - 2.5 (calc)   Total Bilirubin 0.9 0.2 - 1.2 mg/dL   Alkaline phosphatase (APISO) 47 36 - 130 U/L   AST 19 10 - 40 U/L   ALT 20 9 - 46 U/L  CBC with Differential/Platelet  Result Value Ref Range   WBC 4.7 3.8 - 10.8 Thousand/uL   RBC 4.70 4.20 - 5.80 Million/uL   Hemoglobin 15.4 13.2 - 17.1 g/dL   HCT 84.1 66.0 - 63.0 %   MCV 95.5 80.0 - 100.0 fL   MCH 32.8 27.0 - 33.0 pg   MCHC 34.3 32.0 - 36.0 g/dL   RDW 16.0 10.9 - 32.3 %   Platelets 190 140 - 400 Thousand/uL   MPV 10.6 7.5 - 12.5 fL   Neutro Abs 2,773 1,500 - 7,800 cells/uL   Lymphs Abs 1,377 850 - 3,900 cells/uL   Absolute Monocytes 353  200 - 950 cells/uL   Eosinophils Absolute 169 15 - 500 cells/uL   Basophils Absolute 28 0 - 200 cells/uL   Neutrophils Relative % 59 %   Total Lymphocyte 29.3 %   Monocytes Relative 7.5 %   Eosinophils Relative 3.6 %   Basophils Relative 0.6 %  Hemoglobin A1c  Result Value Ref  Range   Hgb A1c MFr Bld 4.5 <5.7 % of total Hgb   Mean Plasma Glucose 82 (calc)   eAG (mmol/L) 4.6 (calc)      Assessment & Plan:   Problem List Items Addressed This Visit     Alcohol dependence, uncomplicated (HCC) - Primary   Relevant Medications   disulfiram (ANTABUSE) 250 MG tablet   naltrexone (DEPADE) 50 MG tablet   Major depressive disorder, recurrent episode, in partial remission (HCC)    Alcohol dependence, uncomplicated Recurrent depression partial remission  Resumed alcohol despite naltrexone 50mg   Dose increase Naltrexone from 50 to 100mg  for better effect. Goal is to cut craving and reduce drive or need or pleasure from drinking.  For the antabuse / disulfiram this should make you sick or nauseas if you drink alcohol, if a larger amount of alcohol consumed can cause significant vomiting. Start this 48 hours AFTER your last drink. Keep it going and can be adjusted if needed.  Try Goodrx[employer]com for deals.  Again discussed may warrant Psych / Therapy mental health assistance, he will reconsider.   Meds ordered this encounter  Medications   disulfiram (ANTABUSE) 250 MG tablet    Sig: Take 1 tablet (250 mg total) by mouth daily.    Dispense:  30 tablet    Refill:  2   naltrexone (DEPADE) 50 MG tablet    Sig: Take 2 tablets (100 mg total) by mouth daily.    Dispense:  60 tablet    Refill:  2    Dose increase      Follow up plan: Return if symptoms worsen or fail to improve.   , DO [facility] 05/15/2022, 9:56 AM

## 2022-05-15 NOTE — Patient Instructions (Addendum)
Thank you for coming to the office today.  Dose increase Naltrexone from 50 to 100mg  for better effect. Goal is to cut craving and reduce drive or need or pleasure from drinking.  For the antabuse / disulfiram this should make you sick or nauseas if you drink alcohol, if a larger amount of alcohol consumed can cause significant vomiting. Start this 48 hours AFTER your last drink. Keep it going and can be adjusted if needed.  Try [employer]rx[employer] for deals.  Please schedule a Follow-up Appointment to: Return if symptoms worsen or fail to improve.  If you have any other questions or concerns, please feel free to call the office or send a message through MyChart. You may also schedule an earlier appointment if necessary.  Additionally, you may be receiving a survey about your experience at our office within a few days to 1 week by e-mail or mail. We value your feedback.  , DO [facility], [facility]

## 2022-06-09 ENCOUNTER — Ambulatory Visit
Admission: EM | Admit: 2022-06-09 | Discharge: 2022-06-09 | Disposition: A | Discharge status: Home / Self Care | Payer: BC Managed Care – PPO | Attending: Urgent Care | Admitting: Urgent Care

## 2022-06-09 DIAGNOSIS — Z23 Encounter for immunization: Secondary | ICD-10-CM

## 2022-06-09 DIAGNOSIS — M79641 Pain in right hand: Secondary | ICD-10-CM

## 2022-06-09 DIAGNOSIS — S61411A Laceration without foreign body of right hand, initial encounter: Secondary | ICD-10-CM

## 2022-06-09 MED ORDER — TETANUS-DIPHTH-ACELL PERTUSSIS 5-2.5-18.5 LF-MCG/0.5 IM SUSY: 0.5000 mL | PREFILLED_SYRINGE | Freq: Once | INTRAMUSCULAR | Status: DC

## 2022-06-09 NOTE — ED Notes (Signed)
Rounding on patient No needs at this time.  Interventions:  given water

## 2022-06-09 NOTE — ED Provider Notes (Signed)
[facility]  Note:  This document was prepared using Systems analyst and may include unintentional dictation errors.  MRN: [id number] DOB: 09/30/1995  Subjective:   Joshua Edwards. is a 27 y.o. male presenting for suffering a right hand laceration today while handling an aluminum sheet-metal.  Needs a Tdap but at the end of his visit ended up declining.  No current facility-administered medications for this encounter.  Current Outpatient Medications:    albuterol (PROVENTIL HFA;VENTOLIN HFA) 108 (90 Base) MCG/ACT inhaler, Inhale 2 puffs into the lungs every 4 (four) hours as needed for wheezing or shortness of breath (cough)., Disp: 1 Inhaler, Rfl: 2   ARIPiprazole (ABILIFY) 5 MG tablet, TAKE 1 TABLET (5 MG TOTAL) BY MOUTH DAILY. (Patient not taking: Reported on 04/03/2022), Disp: 90 tablet, Rfl: 1   cetirizine (ZYRTEC) 10 MG tablet, Take 1 tablet (10 mg total) by mouth daily., Disp: 30 tablet, Rfl: 11   disulfiram (ANTABUSE) 250 MG tablet, Take 1 tablet (250 mg total) by mouth daily., Disp: 30 tablet, Rfl: 2   fluticasone (FLONASE) 50 MCG/ACT nasal spray, Place 2 sprays into both nostrils daily., Disp: , Rfl:    gabapentin (NEURONTIN) 300 MG capsule, Day 1 - 300 mg every 6 hours, Day 2 - 300 mg every 8 hours, Day 3 - 300 mg every 12 hours, Day 4-7 - 300 mg one dose, Disp: 15 capsule, Rfl: 0   naltrexone (DEPADE) 50 MG tablet, Take 2 tablets (100 mg total) by mouth daily., Disp: 60 tablet, Rfl: 2   omega-3 fish oil (MAXEPA) 1000 MG CAPS capsule, Take 2,000 mg by mouth daily with breakfast., Disp: , Rfl:    sertraline (ZOLOFT) 100 MG tablet, Take 1 tablet (100 mg total) by mouth daily. (Patient not taking: Reported on 04/03/2022), Disp: 90 tablet, Rfl: 3   traZODone (DESYREL) 50 MG tablet, TAKE 1 TABLET BY MOUTH EVERYDAY AT BEDTIME (Patient not taking: Reported on 04/03/2022), Disp: 90 tablet, Rfl: 1   No Known Allergies  Past Medical  History:  Diagnosis Date   Anxiety    Asthma      Past Surgical History:  Procedure Laterality Date   POLYPECTOMY     TONSILLECTOMY     WISDOM TOOTH EXTRACTION      Family History  Problem Relation Age of Onset   Depression Mother    Depression Father    Mental illness Maternal Grandfather     Social History   Tobacco Use   Smoking status: Never   Smokeless tobacco: Never  Vaping Use   Vaping Use: Never used  Substance Use Topics   Alcohol use: Yes    Alcohol/week: 3.0 standard drinks of alcohol    Types: 3 Standard drinks or equivalent per week    Comment: three times a week, gin   Drug use: No    ROS   Objective:   Vitals: BP 139/84 (BP Location: Right Arm)   Pulse 71   Temp 98.7 F (37.1 C) (Oral)   Resp 18   SpO2 98%   Physical Exam Constitutional:      General: He is not in acute distress.    Appearance: Normal appearance. He is well-developed and normal weight. He is not ill-appearing, toxic-appearing or diaphoretic.  HENT:     Head: Normocephalic and atraumatic.     Right Ear: External ear normal.     Left Ear: External ear normal.     Nose: Nose normal.  Mouth/Throat:     Pharynx: Oropharynx is clear.  Eyes:     General: No scleral icterus.       Right eye: No discharge.        Left eye: No discharge.     Extraocular Movements: Extraocular movements intact.  Cardiovascular:     Rate and Rhythm: Normal rate.  Pulmonary:     Effort: Pulmonary effort is normal.  Musculoskeletal:       Hands:     Cervical back: Normal range of motion.  Neurological:     Mental Status: He is alert and oriented to person, place, and time.  Psychiatric:        Mood and Affect: Mood normal.        Behavior: Behavior normal.        Thought Content: Thought content normal.        Judgment: Judgment normal.      PROCEDURE NOTE: laceration repair Verbal consent obtained from patient.  Local anesthesia with 6cc Lidocaine 2% with epinephrine.  Wound  explored for tendon, ligament damage. Wound scrubbed with soap and water and rinsed. Wound closed with #6 5-0 Prolene (simple interrupted) sutures.  Wound cleansed and dressed.   Assessment and Plan :   PDMP not reviewed this encounter.  1. Right hand pain   2. Laceration of right hand without foreign body, initial encounter   3. Need for Tdap vaccination     Laceration repaired successfully. Wound care reviewed. Recommended Tylenol and/or ibuprofen for pain control. Return-to-clinic precautions discussed, patient verbalized understanding. Otherwise, follow up in 10 days for suture removal. Counseled patient on potential for adverse effects with medications prescribed/recommended today, ER and return-to-clinic precautions discussed, patient verbalized understanding.    Jaynee Eagles, [place] 06/09/22 [id number]

## 2022-06-09 NOTE — ED Triage Notes (Addendum)
Pt states today while working a piece of metal he obtained a laceration to right hand.  There is a laceration to right hand about 0.5in long, and there is a scant amount of active bleeding.

## 2022-06-09 NOTE — ED Notes (Addendum)
Pt declined Tdap injection, pt was educated on purpose. Pt made aware that if he changes his mind that his PCP should be able to get the injection for him. Pt acknowledges understanding. 

## 2022-06-09 NOTE — Discharge Instructions (Signed)

## 2022-08-13 ENCOUNTER — Other Ambulatory Visit: Payer: Self-pay | Admitting: Family Medicine

## 2022-08-13 DIAGNOSIS — F1020 Alcohol dependence, uncomplicated: Secondary | ICD-10-CM

## 2022-08-13 NOTE — Telephone Encounter (Signed)
Requested medication (s) are due for refill today - yes  Requested medication (s) are on the active medication list -yes  Future visit scheduled -no  Last refill: 05/15/22 #60 2RF  Notes to clinic: non delegated Rx  Requested Prescriptions  Pending Prescriptions Disp Refills   disulfiram (ANTABUSE) 250 MG tablet [Pharmacy Med Name: DISULFIRAM 250 MG TABLET] 90 tablet     Sig: TAKE 1 TABLET BY MOUTH EVERY DAY     Not Delegated - Psychiatry: Drug Dependence Therapy - disulfiram Failed - 08/13/2022  2:04 AM      Failed - This refill cannot be delegated      Failed - CMP completed in the last 6 months    Albumin  Date Value Ref Range Status  12/12/2019 5.0 3.5 - 5.0 g/dL Final   Alkaline Phosphatase  Date Value Ref Range Status  12/12/2019 49 38 - 126 U/L Final   Alkaline phosphatase (APISO)  Date Value Ref Range Status  03/18/2020 47 36 - 130 U/L Final   ALT  Date Value Ref Range Status  03/18/2020 20 9 - 46 U/L Final   AST  Date Value Ref Range Status  03/18/2020 19 10 - 40 U/L Final   BUN  Date Value Ref Range Status  03/18/2020 15 7 - 25 mg/dL Final   Calcium  Date Value Ref Range Status  03/18/2020 9.7 8.6 - 10.3 mg/dL Final   CO2  Date Value Ref Range Status  03/18/2020 29 20 - 32 mmol/L Final   Creat  Date Value Ref Range Status  03/18/2020 0.88 0.60 - 1.35 mg/dL Final   Glucose, Bld  Date Value Ref Range Status  03/18/2020 93 65 - 99 mg/dL Final    Comment:    .            Fasting reference interval .    POC Glucose  Date Value Ref Range Status  02/22/2019 105 (A) 70 - 99 mg/dl Final   Potassium  Date Value Ref Range Status  03/18/2020 3.7 3.5 - 5.3 mmol/L Final   Sodium  Date Value Ref Range Status  03/18/2020 139 135 - 146 mmol/L Final   Total Bilirubin  Date Value Ref Range Status  03/18/2020 0.9 0.2 - 1.2 mg/dL Final   Total Protein  Date Value Ref Range Status  03/18/2020 6.8 6.1 - 8.1 g/dL Final   GFR, Est African American   Date Value Ref Range Status  03/18/2020 138 > OR = 60 mL/min/1.6m2 Final   GFR, Est Non African American  Date Value Ref Range Status  03/18/2020 119 > OR = 60 mL/min/1.46m2 Final         Failed - CBC completed in the last 6 months    WBC  Date Value Ref Range Status  03/18/2020 4.7 3.8 - 10.8 Thousand/uL Final   RBC  Date Value Ref Range Status  03/18/2020 4.70 4.20 - 5.80 Million/uL Final   Hemoglobin  Date Value Ref Range Status  03/18/2020 15.4 13.2 - 17.1 g/dL Final   HCT  Date Value Ref Range Status  03/18/2020 44.9 38.5 - 50.0 % Final   MCHC  Date Value Ref Range Status  03/18/2020 34.3 32.0 - 36.0 g/dL Final   [facility]  Date Value Ref Range Status  03/18/2020 32.8 27.0 - 33.0 pg Final   MCV  Date Value Ref Range Status  03/18/2020 95.5 80.0 - 100.0 fL Final   No results found for: "PLTCOUNTKUC", "LABPLAT", "POCPLA" RDW  Date Value Ref Range Status  03/18/2020 13.0 11.0 - 15.0 % Final         Passed - Valid encounter within last 6 months    Recent Outpatient Visits           3 months ago Alcohol dependence, uncomplicated [facility])   [facility] [place], Devonne Doughty, DO   4 months ago Alcohol dependence, uncomplicated [facility])   [facility] Olin Hauser, DO   1 year ago Major depressive disorder, recurrent episode, in partial remission [facility])   [place], DO   2 years ago Annual physical exam   [facility] Olin Hauser, DO   2 years ago Major depressive disorder, recurrent episode, in partial remission [facility])   [place], DO                 Requested Prescriptions  Pending Prescriptions Disp Refills   disulfiram (ANTABUSE) 250 MG tablet [Pharmacy Med Name: DISULFIRAM 250 MG TABLET] 90 tablet     Sig: TAKE 1 TABLET BY MOUTH EVERY DAY      Not Delegated - Psychiatry: Drug Dependence Therapy - disulfiram Failed - 08/13/2022  2:04 AM      Failed - This refill cannot be delegated      Failed - CMP completed in the last 6 months    Albumin  Date Value Ref Range Status  12/12/2019 5.0 3.5 - 5.0 g/dL Final   Alkaline Phosphatase  Date Value Ref Range Status  12/12/2019 49 38 - 126 U/L Final   Alkaline phosphatase (APISO)  Date Value Ref Range Status  03/18/2020 47 36 - 130 U/L Final   ALT  Date Value Ref Range Status  03/18/2020 20 9 - 46 U/L Final   AST  Date Value Ref Range Status  03/18/2020 19 10 - 40 U/L Final   BUN  Date Value Ref Range Status  03/18/2020 15 7 - 25 mg/dL Final   Calcium  Date Value Ref Range Status  03/18/2020 9.7 8.6 - 10.3 mg/dL Final   CO2  Date Value Ref Range Status  03/18/2020 29 20 - 32 mmol/L Final   Creat  Date Value Ref Range Status  03/18/2020 0.88 0.60 - 1.35 mg/dL Final   Glucose, Bld  Date Value Ref Range Status  03/18/2020 93 65 - 99 mg/dL Final    Comment:    .            Fasting reference interval .    POC Glucose  Date Value Ref Range Status  02/22/2019 105 (A) 70 - 99 mg/dl Final   Potassium  Date Value Ref Range Status  03/18/2020 3.7 3.5 - 5.3 mmol/L Final   Sodium  Date Value Ref Range Status  03/18/2020 139 135 - 146 mmol/L Final   Total Bilirubin  Date Value Ref Range Status  03/18/2020 0.9 0.2 - 1.2 mg/dL Final   Total Protein  Date Value Ref Range Status  03/18/2020 6.8 6.1 - 8.1 g/dL Final   GFR, Est African American  Date Value Ref Range Status  03/18/2020 138 > OR = 60 mL/min/1.12m2 Final   GFR, Est Non African American  Date Value Ref Range Status  03/18/2020 119 > OR = 60 mL/min/1.46m2 Final         Failed - CBC completed in the last  6 months    WBC  Date Value Ref Range Status  03/18/2020 4.7 3.8 - 10.8 Thousand/uL Final   RBC  Date Value Ref Range Status  03/18/2020 4.70 4.20 - 5.80 Million/uL Final    Hemoglobin  Date Value Ref Range Status  03/18/2020 15.4 13.2 - 17.1 g/dL Final   HCT  Date Value Ref Range Status  03/18/2020 44.9 38.5 - 50.0 % Final   MCHC  Date Value Ref Range Status  03/18/2020 34.3 32.0 - 36.0 g/dL Final   [facility]  Date Value Ref Range Status  03/18/2020 32.8 27.0 - 33.0 pg Final   MCV  Date Value Ref Range Status  03/18/2020 95.5 80.0 - 100.0 fL Final   No results found for: "PLTCOUNTKUC", "LABPLAT", "POCPLA" RDW  Date Value Ref Range Status  03/18/2020 13.0 11.0 - 15.0 % Final         Passed - Valid encounter within last 6 months    Recent Outpatient Visits           3 months ago Alcohol dependence, uncomplicated [facility])   [place], DO   4 months ago Alcohol dependence, uncomplicated [facility])   [place], DO   1 year ago Major depressive disorder, recurrent episode, in partial remission [facility])   [place], DO   2 years ago Annual physical exam   [place], DO   2 years ago Major depressive disorder, recurrent episode, in partial remission [facility])   [facility] Donald, Devonne Doughty, [place]

## 2022-08-29 IMAGING — CR DG CHEST 2V
2 series · 2 of 2 positions shown · non-contrast
Comparison: None.

CLINICAL DATA: Positive tuberculosis screening/PVD

EXAM:
CHEST - 2 VIEW

[w chest pa]
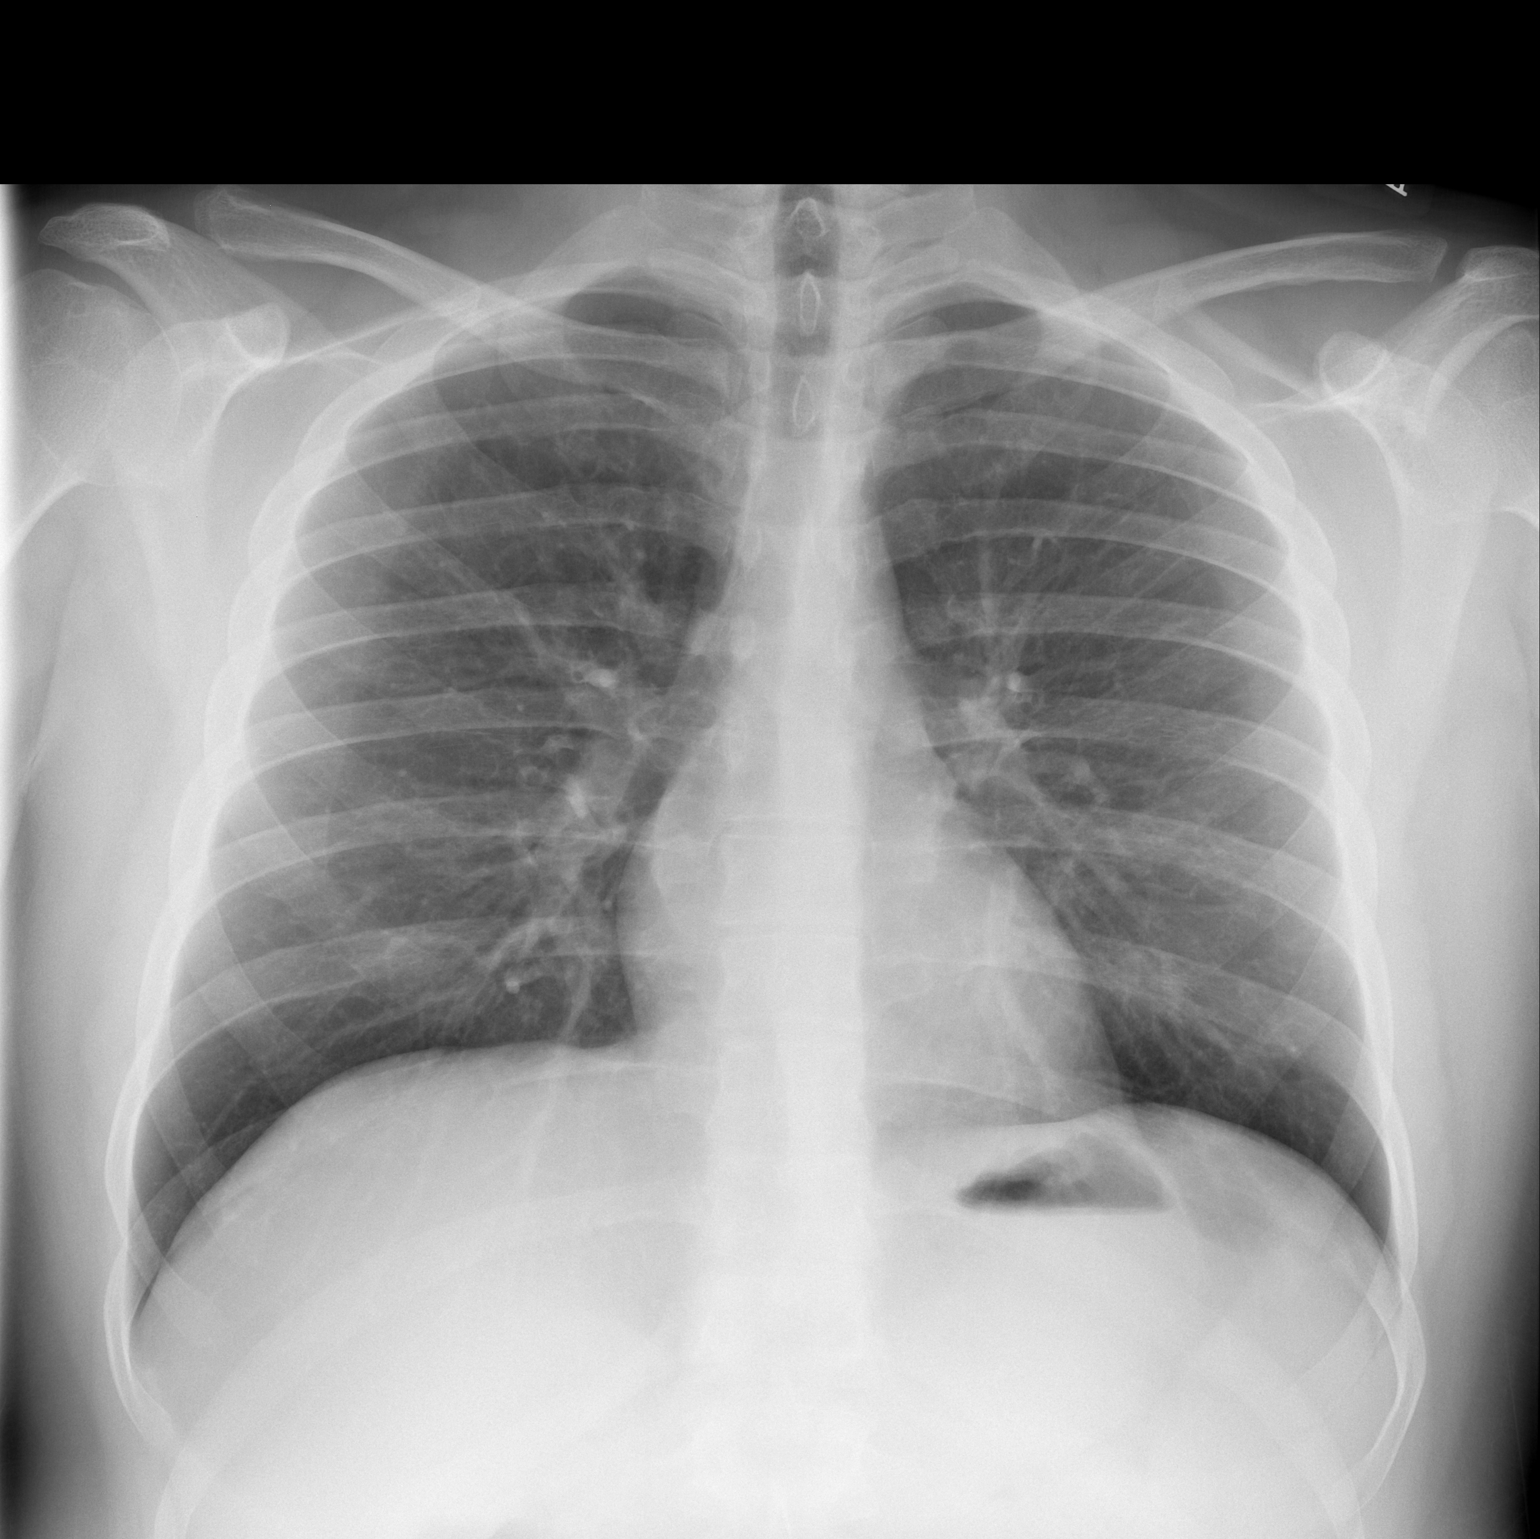

[w chest lat]
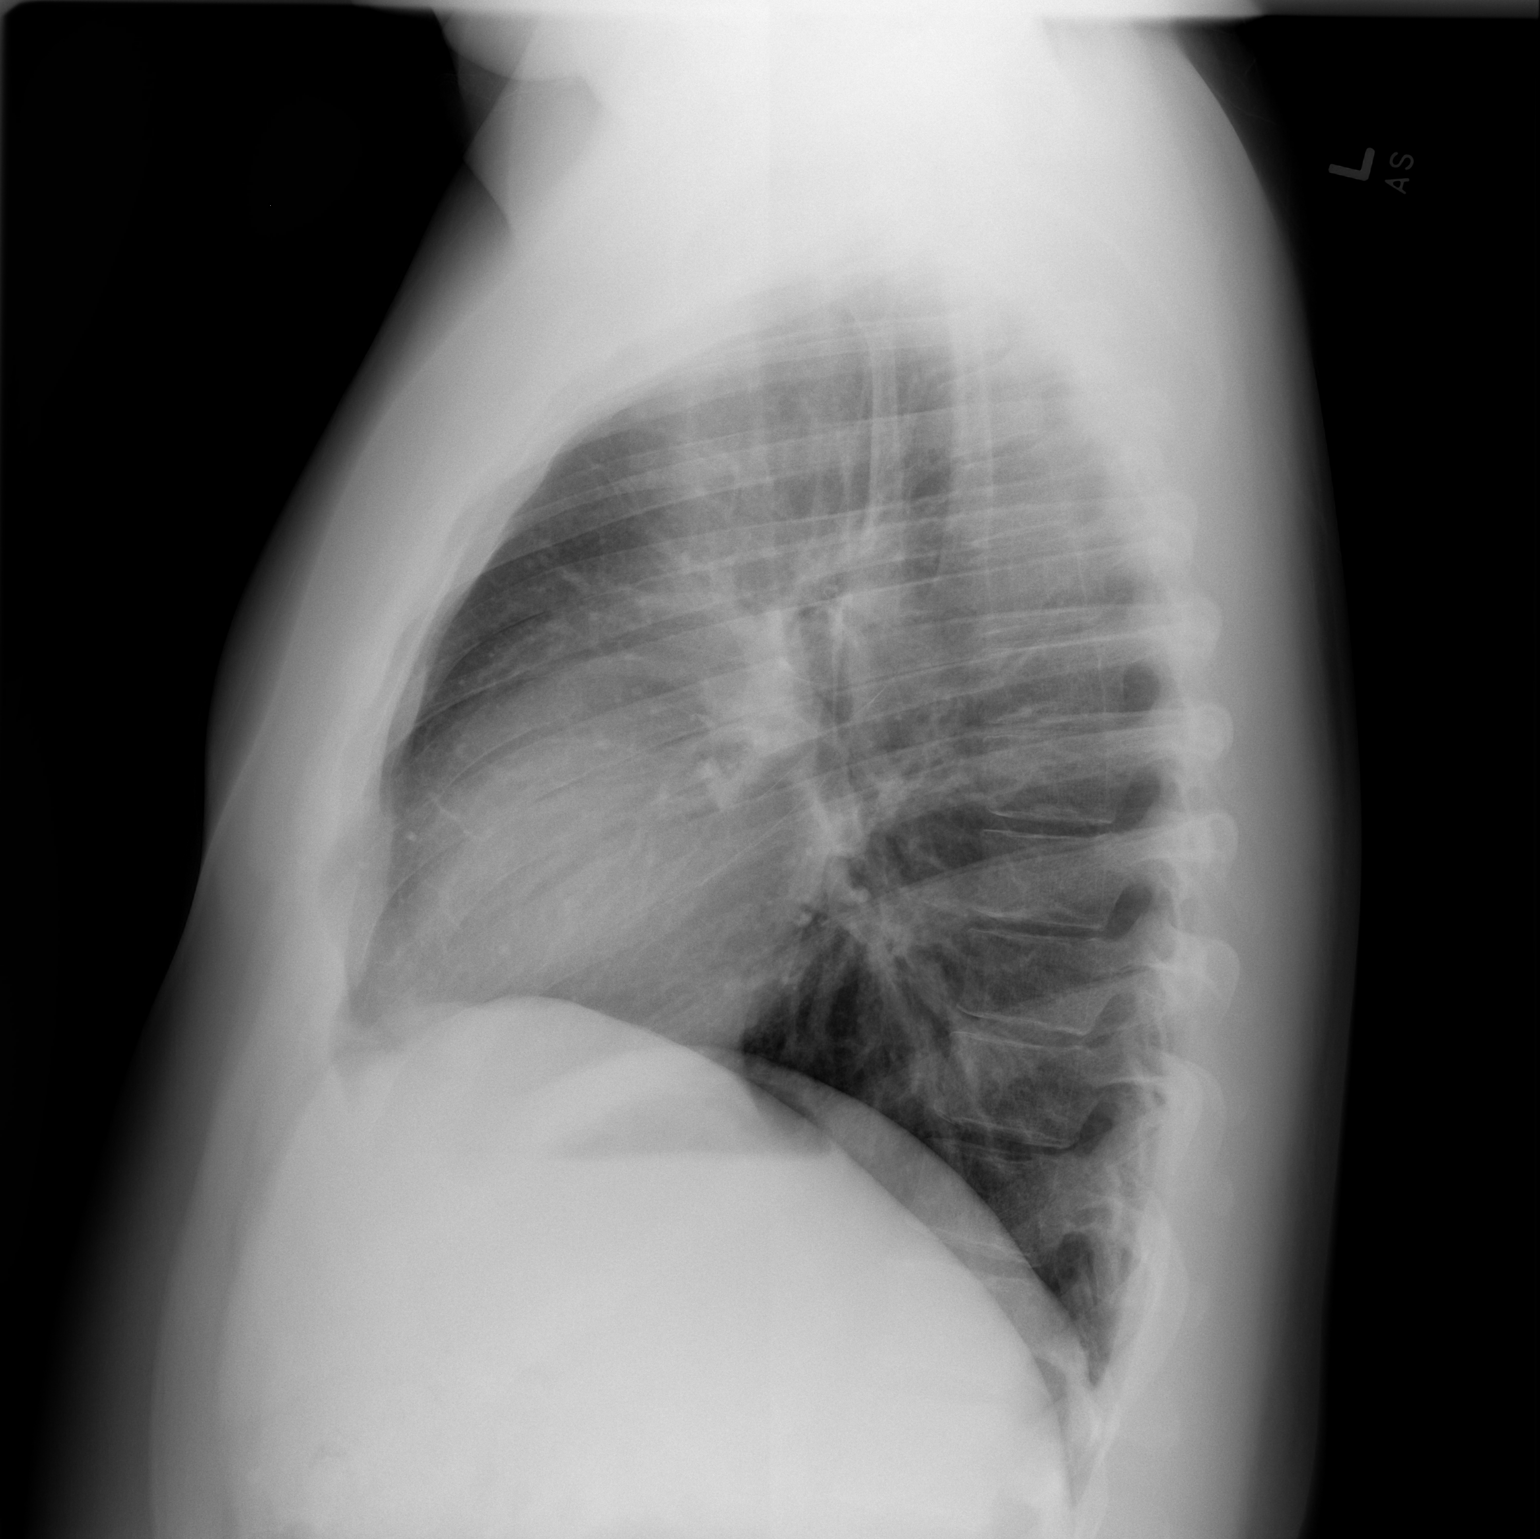

[2 of 2 positions shown; findings below may reference images not displayed]

FINDINGS: Lungs are clear. Heart size and pulmonary vascularity are normal. No
adenopathy. No bone lesions.
IMPRESSION: No abnormality noted.

## 2022-11-09 ENCOUNTER — Other Ambulatory Visit: Payer: Self-pay | Admitting: Family Medicine

## 2022-11-09 DIAGNOSIS — F1020 Alcohol dependence, uncomplicated: Secondary | ICD-10-CM

## 2022-11-09 NOTE — Telephone Encounter (Signed)
Requested medications are due for refill today.  yes  Requested medications are on the active medications list.  yes  Last refill. 08/13/2022 #90 0 rf  Future visit scheduled.   no  Notes to clinic. Refill not delegated.    Requested Prescriptions  Pending Prescriptions Disp Refills   disulfiram (ANTABUSE) 250 MG tablet [Pharmacy Med Name: DISULFIRAM 250 MG TABLET] 90 tablet 0    Sig: TAKE 1 TABLET BY MOUTH EVERY DAY     Not Delegated - Psychiatry: Drug Dependence Therapy - disulfiram Failed - 11/09/2022  2:56 AM      Failed - This refill cannot be delegated      Failed - CMP completed in the last 6 months    Albumin  Date Value Ref Range Status  12/12/2019 5.0 3.5 - 5.0 g/dL Final   Alkaline Phosphatase  Date Value Ref Range Status  12/12/2019 49 38 - 126 U/L Final   Alkaline phosphatase (APISO)  Date Value Ref Range Status  03/18/2020 47 36 - 130 U/L Final   ALT  Date Value Ref Range Status  03/18/2020 20 9 - 46 U/L Final   AST  Date Value Ref Range Status  03/18/2020 19 10 - 40 U/L Final   BUN  Date Value Ref Range Status  03/18/2020 15 7 - 25 mg/dL Final   Calcium  Date Value Ref Range Status  03/18/2020 9.7 8.6 - 10.3 mg/dL Final   CO2  Date Value Ref Range Status  03/18/2020 29 20 - 32 mmol/L Final   Creat  Date Value Ref Range Status  03/18/2020 0.88 0.60 - 1.35 mg/dL Final   Glucose, Bld  Date Value Ref Range Status  03/18/2020 93 65 - 99 mg/dL Final    Comment:    .            Fasting reference interval .    POC Glucose  Date Value Ref Range Status  02/22/2019 105 (A) 70 - 99 mg/dl Final   Potassium  Date Value Ref Range Status  03/18/2020 3.7 3.5 - 5.3 mmol/L Final   Sodium  Date Value Ref Range Status  03/18/2020 139 135 - 146 mmol/L Final   Total Bilirubin  Date Value Ref Range Status  03/18/2020 0.9 0.2 - 1.2 mg/dL Final   Total Protein  Date Value Ref Range Status  03/18/2020 6.8 6.1 - 8.1 g/dL Final   GFR, Est African  American  Date Value Ref Range Status  03/18/2020 138 > OR = 60 mL/min/1.45m2 Final   GFR, Est Non African American  Date Value Ref Range Status  03/18/2020 119 > OR = 60 mL/min/1.87m2 Final         Failed - CBC completed in the last 6 months    WBC  Date Value Ref Range Status  03/18/2020 4.7 3.8 - 10.8 Thousand/uL Final   RBC  Date Value Ref Range Status  03/18/2020 4.70 4.20 - 5.80 Million/uL Final   Hemoglobin  Date Value Ref Range Status  03/18/2020 15.4 13.2 - 17.1 g/dL Final   HCT  Date Value Ref Range Status  03/18/2020 44.9 38.5 - 50.0 % Final   MCHC  Date Value Ref Range Status  03/18/2020 34.3 32.0 - 36.0 g/dL Final   [facility]  Date Value Ref Range Status  03/18/2020 32.8 27.0 - 33.0 pg Final   MCV  Date Value Ref Range Status  03/18/2020 95.5 80.0 - 100.0 fL Final   No results found for: "PLTCOUNTKUC", "  LABPLAT", "POCPLA" RDW  Date Value Ref Range Status  03/18/2020 13.0 11.0 - 15.0 % Final         Passed - Valid encounter within last 6 months    Recent Outpatient Visits           5 months ago Alcohol dependence, uncomplicated [facility])   [place] [facility] [place], Netta Neat, DO   7 months ago Alcohol dependence, uncomplicated [facility])   [place] [facility] Smitty Cords, DO   1 year ago Major depressive disorder, recurrent episode, in partial remission [facility])   [place] [facility] Smitty Cords, DO   2 years ago Annual physical exam   [place] [facility] Smitty Cords, DO   2 years ago Major depressive disorder, recurrent episode, in partial remission [facility])   [place] [facility] [place], Netta Neat, [place]

## 2023-02-07 ENCOUNTER — Other Ambulatory Visit: Payer: Self-pay | Admitting: Family Medicine

## 2023-02-07 DIAGNOSIS — F1020 Alcohol dependence, uncomplicated: Secondary | ICD-10-CM

## 2023-02-09 ENCOUNTER — Other Ambulatory Visit: Payer: Self-pay | Admitting: Family Medicine

## 2023-02-09 DIAGNOSIS — F1020 Alcohol dependence, uncomplicated: Secondary | ICD-10-CM

## 2023-02-11 NOTE — Telephone Encounter (Signed)
Requested medication (s) are due for refill today: yes  Requested medication (s) are on the active medication list: yes  Last refill:  05/15/22  Future visit scheduled: no  Notes to clinic:  Unable to refill per protocol, cannot delegate.      Requested Prescriptions  Pending Prescriptions Disp Refills   naltrexone (DEPADE) 50 MG tablet [Pharmacy Med Name: NALTREXONE 50 MG TABLET] 60 tablet 2    Sig: TAKE 2 TABLETS BY MOUTH EVERY DAY     Not Delegated - Psychiatry: Drug Dependence Therapy - naltrexone Failed - 02/09/2023  5:10 PM      Failed - This refill cannot be delegated      Failed - Valid encounter within last 6 months    Recent Outpatient Visits           9 months ago Alcohol dependence, uncomplicated [facility])   [place] [facility] [place], Netta Neat, DO   10 months ago Alcohol dependence, uncomplicated [facility])   [place] [facility] [place], Netta Neat, DO   2 years ago Major depressive disorder, recurrent episode, in partial remission [facility])   [place] [facility] [place], Netta Neat, DO   2 years ago Annual physical exam   [place] [facility] [place], Netta Neat, DO   3 years ago Major depressive disorder, recurrent episode, in partial remission [facility])   [place] [facility] [place], Netta Neat, DO              Passed - Completed PHQ-2 or PHQ-9 in the last 360 days

## 2023-06-18 ENCOUNTER — Ambulatory Visit (INDEPENDENT_AMBULATORY_CARE_PROVIDER_SITE_OTHER): Payer: BC Managed Care – PPO | Admitting: Family Medicine

## 2023-06-18 ENCOUNTER — Encounter: Payer: Self-pay | Admitting: Family Medicine

## 2023-06-18 VITALS — BP 132/82 | HR 61 | Ht 70.0 in | Wt 194.0 lb

## 2023-06-18 DIAGNOSIS — Z9852 Vasectomy status: Secondary | ICD-10-CM

## 2023-06-18 DIAGNOSIS — E782 Mixed hyperlipidemia: Secondary | ICD-10-CM | POA: Diagnosis not present

## 2023-06-18 DIAGNOSIS — R7309 Other abnormal glucose: Secondary | ICD-10-CM

## 2023-06-18 DIAGNOSIS — F5104 Psychophysiologic insomnia: Secondary | ICD-10-CM

## 2023-06-18 DIAGNOSIS — F1021 Alcohol dependence, in remission: Secondary | ICD-10-CM

## 2023-06-18 DIAGNOSIS — Z0000 Encounter for general adult medical examination without abnormal findings: Secondary | ICD-10-CM

## 2023-06-18 DIAGNOSIS — Z23 Encounter for immunization: Secondary | ICD-10-CM

## 2023-06-18 DIAGNOSIS — F3341 Major depressive disorder, recurrent, in partial remission: Secondary | ICD-10-CM

## 2023-06-18 DIAGNOSIS — F331 Major depressive disorder, recurrent, moderate: Secondary | ICD-10-CM | POA: Diagnosis not present

## 2023-06-18 NOTE — Patient Instructions (Addendum)
Thank you for coming to the office today.  Labs next week, stay tuned for results on MyChart  Keep on current plan with goal to taper off Naltrexone.  Congrats on the lifestyle improvements  Basic Semen Analysis is a sperm test to determine if sperm are present and viable. This can be used as an initial fertility test for men.  - If you would like to check the cost and coverage of the test, you may contact your insurance provider to determine your cost. If you would like to proceed, then notify our office to proceed with the order.  - Information to give to insurance to determine cost: - "Semen Analysis, Basic" - Test Code/#: 387564 - CPT Code: 33295  - To proceed with Semen Analysis testing please follow these instructions: - You will need the following items from our office:    1) Sterile specimen cup / container, within a specimen bag    2) Collection Instructions Form - from [facility]    3) Released order from our office, printed  - The test must be scheduled at least 1 week in advance. They can schedule you weeks or months in advance. - First, read the "Collection Instructions" closely - ask any questions you have when you call to schedule - You need to schedule your specimen drop off appointment with [facility]: Call [phone] (Semen Analysis Scheduling)     (Only 1 [facility] office locally [place], [place]) offers this test. If you are outside area, [facility] can locate a different office for you) - Pick your drop off date and time. They offer AM and Afternoon times. The specimen will need to be collected and then dropped off within 1 hour of collection following their specific instructions. - You should be abstinent / without ejaculation by any means at least 2 to 7 days prior to the specimen collection - On the day of your collection, before you collect your sample, complete the "bottom half of the Information Form" and bring this with your specimen for drop off  Additionally, if  further questions regarding interpretation of semen analysis remain, we may consult Urology Specialist. Or if additional fertility testing is required, we will often assist in referring to a Fertility Clinic.   Please schedule a Follow-up Appointment to: Return in about 1 year (around 06/17/2024).  If you have any other questions or concerns, please feel free to call the office or send a message through MyChart. You may also schedule an earlier appointment if necessary.  Additionally, you may be receiving a survey about your experience at our office within a few days to 1 week by e-mail or mail. We value your feedback.  Saralyn Pilar, DO [facility], [place]

## 2023-06-18 NOTE — Progress Notes (Signed)
Subjective:    Patient ID: Joshua Kinds., male    DOB: 1996-02-14, 28 y.o.   MRN: [id number]  Joshua Edwards. is a 28 y.o. male presenting on 06/18/2023 for Annual Exam   HPI  Discussed the use of AI scribe software for clinical note transcription with the patient, who gave verbal consent to proceed.  History of Present Illness    The patient, a 28 year old individual presented for a general health follow-up.   He reported a significant weight loss of approximately 66 pounds over the past six months, attributing this to a ketogenic diet and regular exercise, including rowing and strength training.  The patient has been managing allergies with Zyrtec and Claritin, particularly after recently acquiring a cat.  Alcohol Dependence history Currently abstained from alcohol for 3-4 months now alcohol free. He also reported taking naltrexone, initially at a dose of 100mg  daily, but has since reduced to 50mg  daily, attributing this reduction to improved control over alcohol consumption.   The patient also mentioned occasional digestive discomfort, attributing this to the consumption of sugar-free candy. He reported no other significant digestive concerns. The patient also reported undergoing laser hair removal on his back, resulting in some persistent spots, but no significant discomfort or concern.  The patient's blood pressure was slightly elevated at 132/82, but within a range not requiring treatment. He expressed a desire to understand potential contributing factors and methods to lower it. The patient also expressed a desire to maintain regular doctor visits and to stay proactive about his health.      History of depression Currently no longer on medication. No new concerns.  Additional question The patient expressed a desire to confirm infertility, having had a vasectomy several years prior.  HM Due Tdap     06/18/2023    6:36 PM 05/15/2022    9:33 AM 04/03/2022    10:05 AM  Depression screen PHQ 2/9  Decreased Interest 1 1 2   Down, Depressed, Hopeless 0 1 0  PHQ - 2 Score 1 2 2   Altered sleeping 1 1 0  Tired, decreased energy 0 1 1  Change in appetite 0 2 0  Feeling bad or failure about yourself  0 1 0  Trouble concentrating 0 0 0  Moving slowly or fidgety/restless 0 0 0  Suicidal thoughts 0 1 1  PHQ-9 Score 2 8 4   Difficult doing work/chores Not difficult at all Somewhat difficult Not difficult at all       Past Medical History:  Diagnosis Date   Anxiety    Asthma    Past Surgical History:  Procedure Laterality Date   POLYPECTOMY     TONSILLECTOMY     WISDOM TOOTH EXTRACTION     Social History   Socioeconomic History   Marital status: Single    Spouse name: Not on file   Number of children: Not on file   Years of education: Not on file   Highest education level: Associate degree: academic program  Occupational History   Occupation: Pending interview  Tobacco Use   Smoking status: Never   Smokeless tobacco: Never  Vaping Use   Vaping status: Never Used  Substance and Sexual Activity   Alcohol use: Yes    Alcohol/week: 3.0 standard drinks of alcohol    Types: 3 Standard drinks or equivalent per week    Comment: three times a week, gin   Drug use: No   Sexual activity: Never  Other Topics Concern  Not on file  Social History Narrative   Not on file   Social Drivers of Health   Financial Resource Strain: Low Risk  (06/14/2023)   Overall Financial Resource Strain (CARDIA)    Difficulty of Paying Living Expenses: Not very hard  Food Insecurity: No Food Insecurity (06/14/2023)   Hunger Vital Sign    Worried About Running Out of Food in the Last Year: Never true    Ran Out of Food in the Last Year: Never true  Transportation Needs: No Transportation Needs (06/14/2023)   PRAPARE - Administrator, Civil Service (Medical): No    Lack of Transportation (Non-Medical): No  Physical Activity: Insufficiently  Active (06/14/2023)   Exercise Vital Sign    Days of Exercise per Week: 3 days    Minutes of Exercise per Session: 30 min  Stress: Stress Concern Present (06/14/2023)   [employer] of Occupational Health - Occupational Stress Questionnaire    Feeling of Stress : To some extent  Social Connections: Socially Isolated (06/14/2023)   Social Connection and Isolation Panel [NHANES]    Frequency of Communication with Friends and Family: Once a week    Frequency of Social Gatherings with Friends and Family: Once a week    Attends Religious Services: Never    Database administrator or Organizations: No    Attends Engineer, structural: Not on file    Marital Status: Never married  Catering manager Violence: Not on file   Family History  Problem Relation Age of Onset   Depression Mother    Depression Father    Mental illness Maternal Grandfather    Current Outpatient Medications on File Prior to Visit  Medication Sig   albuterol (PROVENTIL HFA;VENTOLIN HFA) 108 (90 Base) MCG/ACT inhaler Inhale 2 puffs into the lungs every 4 (four) hours as needed for wheezing or shortness of breath (cough).   cetirizine (ZYRTEC) 10 MG tablet Take 1 tablet (10 mg total) by mouth daily.   fluticasone (FLONASE) 50 MCG/ACT nasal spray Place 2 sprays into both nostrils daily.   naltrexone (DEPADE) 50 MG tablet TAKE 2 TABLETS BY MOUTH EVERY DAY   omega-3 fish oil (MAXEPA) 1000 MG CAPS capsule Take 2,000 mg by mouth daily with breakfast.   No current facility-administered medications on file prior to visit.    Review of Systems  Constitutional:  Negative for activity change, appetite change, chills, diaphoresis, fatigue and fever.  HENT:  Negative for congestion and hearing loss.   Eyes:  Negative for visual disturbance.  Respiratory:  Negative for cough, chest tightness, shortness of breath and wheezing.   Cardiovascular:  Negative for chest pain, palpitations and leg swelling.  Gastrointestinal:   Negative for abdominal pain, constipation, diarrhea, nausea and vomiting.  Genitourinary:  Negative for dysuria, frequency and hematuria.  Musculoskeletal:  Negative for arthralgias and neck pain.  Skin:  Negative for rash.  Neurological:  Negative for dizziness, weakness, light-headedness, numbness and headaches.  Hematological:  Negative for adenopathy.  Psychiatric/Behavioral:  Negative for behavioral problems, dysphoric mood and sleep disturbance.    Per HPI unless specifically indicated above     Objective:    BP 132/82   Pulse 61   Ht 5\' 10"  (1.778 m)   Wt 194 lb (88 kg)   SpO2 98%   BMI 27.84 kg/m   Wt Readings from Last 3 Encounters:  06/18/23 194 lb (88 kg)  05/15/22 224 lb (101.6 kg)  04/03/22 224 lb (101.6 kg)  Physical Exam Vitals and nursing note reviewed.  Constitutional:      General: He is not in acute distress.    Appearance: He is well-developed. He is not diaphoretic.     Comments: Well-appearing, comfortable, cooperative  HENT:     Head: Normocephalic and atraumatic.  Eyes:     General:        Right eye: No discharge.        Left eye: No discharge.     Conjunctiva/sclera: Conjunctivae normal.     Pupils: Pupils are equal, round, and reactive to light.  Neck:     Thyroid: No thyromegaly.     Vascular: No carotid bruit.  Cardiovascular:     Rate and Rhythm: Normal rate and regular rhythm.     Pulses: Normal pulses.     Heart sounds: Normal heart sounds. No murmur heard. Pulmonary:     Effort: Pulmonary effort is normal. No respiratory distress.     Breath sounds: Normal breath sounds. No wheezing or rales.  Abdominal:     General: Bowel sounds are normal. There is no distension.     Palpations: Abdomen is soft. There is no mass.     Tenderness: There is no abdominal tenderness.  Musculoskeletal:        General: No tenderness. Normal range of motion.     Cervical back: Normal range of motion and neck supple.     Right lower leg: No edema.      Left lower leg: No edema.     Comments: Upper / Lower Extremities: - Normal muscle tone, strength bilateral upper extremities 5/5, lower extremities 5/5  Lymphadenopathy:     Cervical: No cervical adenopathy.  Skin:    General: Skin is warm and dry.     Findings: No erythema or rash.  Neurological:     Mental Status: He is alert and oriented to person, place, and time.     Comments: Distal sensation intact to light touch all extremities  Psychiatric:        Mood and Affect: Mood normal.        Behavior: Behavior normal.        Thought Content: Thought content normal.     Comments: Well groomed, good eye contact, normal speech and thoughts     Results for orders placed or performed in visit on 03/15/20  Hepatitis C antibody   Collection Time: 03/18/20  7:56 AM  Result Value Ref Range   Hepatitis C Ab NON-REACTIVE NON-REACTI   SIGNAL TO CUT-OFF 0.01 <1.00  HIV Antibody (routine testing w rflx)   Collection Time: 03/18/20  7:56 AM  Result Value Ref Range   HIV 1&2 Ab, 4th Generation NON-REACTIVE NON-REACTI  TSH   Collection Time: 03/18/20  7:56 AM  Result Value Ref Range   TSH 2.51 0.40 - 4.50 mIU/L  Lipid panel   Collection Time: 03/18/20  7:56 AM  Result Value Ref Range   Cholesterol 208 (H) <200 mg/dL   HDL 39 (L) > OR = 40 mg/dL   Triglycerides 161 (H) <150 mg/dL   LDL Cholesterol (Calc)  mg/dL (calc)   Total CHOL/HDL Ratio 5.3 (H) <5.0 (calc)   Non-HDL Cholesterol (Calc) 169 (H) <130 mg/dL (calc)  COMPLETE METABOLIC PANEL WITH GFR   Collection Time: 03/18/20  7:56 AM  Result Value Ref Range   Glucose, Bld 93 65 - 99 mg/dL   BUN 15 7 - 25 mg/dL   Creat 0.96 0.45 - 4.09 mg/dL  GFR, Est Non African American 119 > OR = 60 mL/min/1.103m2   GFR, Est African American 138 > OR = 60 mL/min/1.34m2   BUN/Creatinine Ratio NOT APPLICABLE 6 - 22 (calc)   Sodium 139 135 - 146 mmol/L   Potassium 3.7 3.5 - 5.3 mmol/L   Chloride 101 98 - 110 mmol/L   CO2 29 20 - 32 mmol/L    Calcium 9.7 8.6 - 10.3 mg/dL   Total Protein 6.8 6.1 - 8.1 g/dL   Albumin 4.8 3.6 - 5.1 g/dL   Globulin 2.0 1.9 - 3.7 g/dL (calc)   AG Ratio 2.4 1.0 - 2.5 (calc)   Total Bilirubin 0.9 0.2 - 1.2 mg/dL   Alkaline phosphatase (APISO) 47 36 - 130 U/L   AST 19 10 - 40 U/L   ALT 20 9 - 46 U/L  CBC with Differential/Platelet   Collection Time: 03/18/20  7:56 AM  Result Value Ref Range   WBC 4.7 3.8 - 10.8 Thousand/uL   RBC 4.70 4.20 - 5.80 Million/uL   Hemoglobin 15.4 13.2 - 17.1 g/dL   HCT 96.0 45.4 - 09.8 %   MCV 95.5 80.0 - 100.0 fL   MCH 32.8 27.0 - 33.0 pg   MCHC 34.3 32.0 - 36.0 g/dL   RDW 11.9 14.7 - 82.9 %   Platelets 190 140 - 400 Thousand/uL   MPV 10.6 7.5 - 12.5 fL   Neutro Abs 2,773 1,500 - 7,800 cells/uL   Lymphs Abs 1,377 850 - 3,900 cells/uL   Absolute Monocytes 353 200 - 950 cells/uL   Eosinophils Absolute 169 15 - 500 cells/uL   Basophils Absolute 28 0 - 200 cells/uL   Neutrophils Relative % 59 %   Total Lymphocyte 29.3 %   Monocytes Relative 7.5 %   Eosinophils Relative 3.6 %   Basophils Relative 0.6 %  Hemoglobin A1c   Collection Time: 03/18/20  7:56 AM  Result Value Ref Range   Hgb A1c MFr Bld 4.5 <5.7 % of total Hgb   Mean Plasma Glucose 82 (calc)   eAG (mmol/L) 4.6 (calc)      Assessment & Plan:   Problem List Items Addressed This Visit   None Visit Diagnoses       Annual physical exam    -  Primary     History of vasectomy       Relevant Orders   Semen Analysis, Basic     Need for diphtheria-tetanus-pertussis (Tdap) vaccine       Relevant Orders   Tdap vaccine greater than or equal to 7yo IM (Completed)        Updated Health Maintenance information Future fasting labs due Encouraged improvement to lifestyle with diet and exercise Goal of weight loss  Major Depression recurrent, partial remission Improved mental health currently No longer on medication No concerns today, he will follow-up with new concerns.  Hyperlipidemia Elevated  cholesterol levels noted in 2021. Discussed the importance of fasting prior to blood work for accurate results. -Schedule fasting blood panel for 06/23/2023 at 8:15am.  Weight Loss Significant weight loss achieved through diet and exercise. Current weight 194 lbs, down from 224 lbs. Goal weight 180 lbs. -Continue current diet and exercise regimen.  Post-Vasectomy Semen Analysis Prior history of vasectomy years ago 2021. Reports prior semen analysis negative, but he requests repeat specimen test -Order [facility] through [facility]. Specimen cups and instructions given, he will schedule w/ [facility]  Vaccinations Up to date on flu and Hep B vaccines. Tetanus  vaccine due. -Administer TDAP vaccine today.  Alcohol Dependence, in remission Self-reported abstinence from alcohol for the past few months. Currently taking Naltrexone 50mg  daily, down from 100mg  daily. Considering discontinuing Naltrexone after current round. -Continue Naltrexone 50mg  daily as tolerated.  Blood Pressure mild elevated No hypertension Slightly elevated systolic blood pressure noted today. Discussed lifestyle modifications including low sodium and caffeine intake, continued exercise, and stress management. -Monitor blood pressure at annual visits.  General Health Maintenance / Followup Plans: -Schedule annual wellness physical for January 2026. -Continue current medications Zyrtec, Claritin, and double dose of fish oil. -Dispose of excess Antabuse at retail pharmacy.        Orders Placed This Encounter  Procedures   Tdap vaccine greater than or equal to 7yo IM   Semen Analysis, Basic    No orders of the defined types were placed in this encounter.    Follow up plan: Return in about 1 year (around 06/17/2024) for 1 year Annual Physical (AM fasting lab AFTER).  Future labs 06/23/23 Basic Semen Analysis printed for [facility]  Saralyn Pilar, DO [facility] 06/18/2023, 2:43 PM

## 2023-06-23 ENCOUNTER — Other Ambulatory Visit: Payer: BC Managed Care – PPO

## 2023-06-23 DIAGNOSIS — F1021 Alcohol dependence, in remission: Secondary | ICD-10-CM

## 2023-06-23 DIAGNOSIS — E782 Mixed hyperlipidemia: Secondary | ICD-10-CM | POA: Diagnosis not present

## 2023-06-23 DIAGNOSIS — R7309 Other abnormal glucose: Secondary | ICD-10-CM

## 2023-06-23 DIAGNOSIS — Z0000 Encounter for general adult medical examination without abnormal findings: Secondary | ICD-10-CM | POA: Diagnosis not present

## 2023-06-24 ENCOUNTER — Encounter: Payer: Self-pay | Admitting: Family Medicine

## 2023-06-24 LAB — COMPLETE METABOLIC PANEL WITH GFR
AG Ratio: 2.8 (calc) — ABNORMAL HIGH (ref 1.0–2.5)
ALT: 11 U/L (ref 9–46)
AST: 13 U/L (ref 10–40)
Albumin: 4.8 g/dL (ref 3.6–5.1)
Alkaline phosphatase (APISO): 30 U/L — ABNORMAL LOW (ref 36–130)
BUN: 13 mg/dL (ref 7–25)
CO2: 30 mmol/L (ref 20–32)
Calcium: 9.4 mg/dL (ref 8.6–10.3)
Chloride: 101 mmol/L (ref 98–110)
Creat: 0.92 mg/dL (ref 0.60–1.24)
Globulin: 1.7 g/dL — ABNORMAL LOW (ref 1.9–3.7)
Glucose, Bld: 86 mg/dL (ref 65–99)
Potassium: 3.7 mmol/L (ref 3.5–5.3)
Sodium: 139 mmol/L (ref 135–146)
Total Bilirubin: 0.9 mg/dL (ref 0.2–1.2)
Total Protein: 6.5 g/dL (ref 6.1–8.1)
eGFR: 116 mL/min/{1.73_m2} (ref >=60)

## 2023-06-24 LAB — CBC WITH DIFFERENTIAL/PLATELET
Absolute Lymphocytes: 1585 {cells}/uL (ref 850–3900)
Absolute Monocytes: 262 {cells}/uL (ref 200–950)
Basophils Absolute: 30 {cells}/uL (ref 0–200)
Basophils Relative: 0.8 %
Eosinophils Absolute: 110 {cells}/uL (ref 15–500)
Eosinophils Relative: 2.9 %
HCT: 44.8 % (ref 38.5–50.0)
Hemoglobin: 15.2 g/dL (ref 13.2–17.1)
MCH: 31.6 pg (ref 27.0–33.0)
MCHC: 33.9 g/dL (ref 32.0–36.0)
MCV: 93.1 fL (ref 80.0–100.0)
MPV: 10.4 fL (ref 7.5–12.5)
Monocytes Relative: 6.9 %
Neutro Abs: 1813 {cells}/uL (ref 1500–7800)
Neutrophils Relative %: 47.7 %
Platelets: 196 10*3/uL (ref 140–400)
RBC: 4.81 10*6/uL (ref 4.20–5.80)
RDW: 12.6 % (ref 11.0–15.0)
Total Lymphocyte: 41.7 %
WBC: 3.8 10*3/uL (ref 3.8–10.8)

## 2023-06-24 LAB — LIPID PANEL
Cholesterol: 209 mg/dL — ABNORMAL HIGH (ref <200)
HDL: 44 mg/dL (ref >=40)
LDL Cholesterol (Calc): 149 mg/dL — ABNORMAL HIGH
Non-HDL Cholesterol (Calc): 165 mg/dL — ABNORMAL HIGH (ref <130)
Total CHOL/HDL Ratio: 4.8 (calc) (ref <5.0)
Triglycerides: 68 mg/dL (ref <150)

## 2023-06-24 LAB — HEMOGLOBIN A1C
Hgb A1c MFr Bld: 5 %{Hb} (ref <5.7)
Mean Plasma Glucose: 97 mg/dL
eAG (mmol/L): 5.4 mmol/L

## 2023-06-24 LAB — TSH: TSH: 1.78 m[IU]/L (ref 0.40–4.50)

## 2023-06-28 ENCOUNTER — Encounter: Payer: Self-pay | Admitting: Family Medicine

## 2023-06-28 DIAGNOSIS — F5104 Psychophysiologic insomnia: Secondary | ICD-10-CM

## 2023-06-28 DIAGNOSIS — F419 Anxiety disorder, unspecified: Secondary | ICD-10-CM

## 2023-06-28 DIAGNOSIS — Z9852 Vasectomy status: Secondary | ICD-10-CM

## 2023-06-30 MED ORDER — GABAPENTIN 300 MG PO CAPS: 300.0000 mg | ORAL_CAPSULE | Freq: Three times a day (TID) | ORAL | 1 refills | Status: DC

## 2023-06-30 NOTE — Addendum Note (Signed)
 Addended by: Raina Bunting on: 06/30/2023 05:54 PM   Modules accepted: Orders

## 2023-07-02 ENCOUNTER — Encounter: Payer: Self-pay | Admitting: Family Medicine

## 2023-07-02 DIAGNOSIS — F331 Major depressive disorder, recurrent, moderate: Secondary | ICD-10-CM | POA: Diagnosis not present

## 2023-07-02 LAB — POST-VAS SPERM EVALUATION,QUAL: Volume: 5.4 mL

## 2023-12-26 ENCOUNTER — Encounter: Payer: Self-pay | Admitting: Family Medicine

## 2023-12-29 ENCOUNTER — Telehealth (INDEPENDENT_AMBULATORY_CARE_PROVIDER_SITE_OTHER): Payer: Self-pay | Admitting: Family Medicine

## 2023-12-29 ENCOUNTER — Encounter: Payer: Self-pay | Admitting: Family Medicine

## 2023-12-29 DIAGNOSIS — F5104 Psychophysiologic insomnia: Secondary | ICD-10-CM

## 2023-12-29 DIAGNOSIS — F331 Major depressive disorder, recurrent, moderate: Secondary | ICD-10-CM | POA: Diagnosis not present

## 2023-12-29 DIAGNOSIS — Z8669 Personal history of other diseases of the nervous system and sense organs: Secondary | ICD-10-CM

## 2023-12-29 MED ORDER — SERTRALINE HCL 100 MG PO TABS: 100.0000 mg | ORAL_TABLET | Freq: Every day | ORAL | 2 refills | Status: DC

## 2023-12-29 NOTE — Patient Instructions (Addendum)
 Start Sertraline  100mg  tablets for depression First 1-2 weeks take HALF tab = 50mg  daily, then after that go ahead and increase to 1 whole tab 100mg  daily Follow up in 4 weeks to discuss progress and medication adjustment Keep with therapist weekly  If thoughts of harming yourself or others, or any significant concern about your safety, please call for help immediately:  - [facility] 24 Hour Help Line [phone] or [phone] - Suicide prevention hotline [phone]  - 911    Please schedule a Follow-up Appointment to: Return in about 4 weeks (around 01/26/2024), or if symptoms worsen or fail to improve.  If you have any other questions or concerns, please feel free to call the office or send a message through MyChart. You may also schedule an earlier appointment if necessary.  Additionally, you may be receiving a survey about your experience at our office within a few days to 1 week by e-mail or mail. We value your feedback.  Marsa Officer, DO [facility], [place]

## 2023-12-29 NOTE — Addendum Note (Signed)
 Addended by: EDMAN MARSA PARAS on: 12/29/2023 04:48 PM   Modules accepted: Level of Service

## 2023-12-29 NOTE — Progress Notes (Signed)
 Subjective:    Patient ID: Joshua Edwards., male    DOB: 1996/05/08, 28 y.o.   MRN: [id number]  Joshua Zurn. is a 28 y.o. male presenting on 12/29/2023 for Depression   Virtual / Telehealth Encounter - Video Visit via MyChart The purpose of this virtual visit is to provide medical care while limiting exposure to the novel coronavirus (COVID19) for both patient and office staff.  Consent was obtained for remote visit:  Yes.   Answered questions that patient had about telehealth interaction:  Yes.   I discussed the limitations, risks, security and privacy concerns of performing an evaluation and management service by video/telephone. I also discussed with the patient that there may be a patient responsible charge related to this service. The patient expressed understanding and agreed to proceed.  Patient Location: Home Provider Location: [facility] (Office)  Participants in virtual visit: - Patient: Joshua Edwards - CMA: Alan Fontana CMA - Provider: Dr Edman   HPI  Discussed the use of AI scribe software for clinical note transcription with the patient, who gave verbal consent to proceed.  History of Present Illness   Joshua Edwards is a 28 year old male with depression who presents with worsening mood symptoms.  Major Depression Recurrent Chronic history depression. - Worsening mood symptoms over several months, including fatigue, lack of motivation, and diminished interest in activities - Desires to initiate pharmacologic treatment for symptom management - No suicidal ideation or homicidal ideation - Engaged in weekly therapy sessions through [facility]. They encouraged him to schedule w/ me to discuss medication  Antidepressant medication history - Previous use of sertraline  (Zoloft ) for depression, with dosages ranging from 50 mg to 150 mg - In 2020, sertraline  was associated with improved mood and function,  though reduced motivation persisted - Currently unable to recall the specific effects of sertraline  - Past use of Abilify  and trazodone , with no recollection of his effects - Not currently taking any psychiatric medications  History of Migraine Headaches - Experiences migraines with aura - Family history of migraines - Requests a physician's note to allow a water bottle at work to assist with migraine prevention and hydration  Alcohol dependence history - No alcohol consumption in the past eight months         12/29/2023    4:32 PM 06/18/2023    6:36 PM 05/15/2022    9:33 AM  Depression screen PHQ 2/9  Decreased Interest 3 1 1   Down, Depressed, Hopeless 3 0 1  PHQ - 2 Score 6 1 2   Altered sleeping 1 1 1   Tired, decreased energy 3 0 1  Change in appetite 0 0 2  Feeling bad or failure about yourself  0 0 1  Trouble concentrating 1 0 0  Moving slowly or fidgety/restless 0 0 0  Suicidal thoughts 0 0 1  PHQ-9 Score 11 2 8   Difficult doing work/chores Very difficult Not difficult at all Somewhat difficult       05/15/2022    9:34 AM 04/03/2022   10:05 AM 01/16/2020    9:40 AM 12/20/2019    3:10 PM  GAD 7 : Generalized Anxiety Score  Nervous, Anxious, on Edge 0 0 0 0  Control/stop worrying 0 0 0 1  Worry too much - different things 0 1 1 1   Trouble relaxing 1 0 0 0  Restless 0 0 0 0  Easily annoyed or irritable 1 1 0 1  Afraid - awful might happen 0 0 0 0  Total GAD 7 Score 2 2 1 3   Anxiety Difficulty Not difficult at all Not difficult at all Not difficult at all Not difficult at all    Social History   Tobacco Use   Smoking status: Never   Smokeless tobacco: Never  Vaping Use   Vaping status: Never Used  Substance Use Topics   Alcohol use: Yes    Alcohol/week: 3.0 standard drinks of alcohol    Types: 3 Standard drinks or equivalent per week    Comment: three times a week, gin   Drug use: No    Review of Systems Per HPI unless specifically indicated above      Objective:    There were no vitals taken for this visit.  Wt Readings from Last 3 Encounters:  06/18/23 194 lb (88 kg)  05/15/22 224 lb (101.6 kg)  04/03/22 224 lb (101.6 kg)     Physical Exam  Note examination was completely remotely via video observation objective data only  Gen - well-appearing, no acute distress or apparent pain, comfortable HEENT - eyes appear clear without discharge or redness Heart/Lungs - cannot examine virtually - observed no evidence of coughing or labored breathing. Abd - cannot examine virtually  Skin - face visible today- no rash Neuro - awake, alert, oriented Psych - depressed affect mood, no obvious anxiety. No abnormal behavior   Results for orders placed or performed in visit on 06/28/23  Post-Vas Sperm Evaluation,Qual   Collection Time: 07/02/23 12:00 AM  Result Value Ref Range   Volume 5.4 Not Estab. mL   Post-Vas Sperm, Qual Comment Absent      Assessment & Plan:   Problem List Items Addressed This Visit     Major depressive disorder, recurrent, moderate (HCC) - Primary   Relevant Medications   sertraline  (ZOLOFT ) 100 MG tablet   Psychophysiological insomnia   Other Visit Diagnoses       History of migraine            Major depressive disorder, moderate to severe, recurrent Chronic history of mood disorder Past history of alcohol dependence, he reports to be alcohol free abstinent for 8 months now  Worsening depression with fatigue, lack of motivation, and low mood. Previous sertraline  use improved mood and function. No self-harm thoughts. Abstinent from alcohol. Attending weekly therapy virtual [facility]  - Prescribed sertraline  100 mg tablets. Instructed to take 50 mg daily for 1-2 weeks, then increase to 100 mg daily. - Follow-up in four weeks to assess effectiveness and adjust treatment. - Note past meds with Abilify , Trazodone  uncertain benefit. We will reconsider other med addition in future.  Migraine with aura, not  intractable, without status migrainosus - Provided doctor's note for water bottle allowance at work.       No orders of the defined types were placed in this encounter.   Meds ordered this encounter  Medications   sertraline  (ZOLOFT ) 100 MG tablet    Sig: Take 1 tablet (100 mg total) by mouth daily. First 1-2 weeks take half tablet = 50mg .    Dispense:  30 tablet    Refill:  2    Follow up plan: Return in about 4 weeks (around 01/26/2024), or if symptoms worsen or fail to improve.   Patient verbalizes understanding with the above medical recommendations including the limitation of remote medical advice.  Specific follow-up and call-back criteria were given for patient to follow-up or seek medical care  more urgently if needed.  Total duration of direct patient care provided via video conference: 10 minutes   Marsa Officer, DO [facility] 12/29/2023, 4:18 PM

## 2024-06-02 ENCOUNTER — Encounter: Payer: Self-pay | Admitting: Family Medicine

## 2024-06-02 ENCOUNTER — Ambulatory Visit: Payer: Self-pay | Admitting: Family Medicine

## 2024-06-02 VITALS — BP 110/78 | HR 61 | Ht 70.0 in | Wt 219.1 lb

## 2024-06-02 DIAGNOSIS — E782 Mixed hyperlipidemia: Secondary | ICD-10-CM | POA: Diagnosis not present

## 2024-06-02 DIAGNOSIS — Z0000 Encounter for general adult medical examination without abnormal findings: Secondary | ICD-10-CM

## 2024-06-02 DIAGNOSIS — F1021 Alcohol dependence, in remission: Secondary | ICD-10-CM | POA: Diagnosis not present

## 2024-06-02 DIAGNOSIS — R7309 Other abnormal glucose: Secondary | ICD-10-CM

## 2024-06-02 DIAGNOSIS — F5104 Psychophysiologic insomnia: Secondary | ICD-10-CM | POA: Diagnosis not present

## 2024-06-02 DIAGNOSIS — F331 Major depressive disorder, recurrent, moderate: Secondary | ICD-10-CM

## 2024-06-02 DIAGNOSIS — F419 Anxiety disorder, unspecified: Secondary | ICD-10-CM

## 2024-06-02 MED ORDER — ESCITALOPRAM OXALATE 10 MG PO TABS: 10.0000 mg | ORAL_TABLET | Freq: Every day | ORAL | 1 refills | Status: AC

## 2024-06-02 NOTE — Progress Notes (Signed)
 "  Subjective:    Patient ID: Joshua GORMAN Darilyn Mickey., male    DOB: 12-17-1995, 29 y.o.   MRN: [id number]  Joshua Russomanno. is a 29 y.o. male presenting on 06/02/2024 for Annual Exam   HPI  Annual   Obesity BMI >31 Improving diet. Now following plan with [employer] - Keto Diet Paused exercise regimen but goal to resume  Alcohol Dependence - in remission No alcohol in 1 year 4 months, phased out, 1 consumption - Went to rehab for 1 month, tried AA  Major Depression, recurrent moderate Chronic problem. Persistent depressed mood. Some secondary anxiety insomnia but mostly depression On Sertraline  from before. Interested in change. No longer on other medication including Trazodone  Previously worked difficult place for 6 months, now back at previous job  Allergies  The patient has been managing allergies with Zyrtec  and Claritin, particularly after recently acquiring a cat.       06/02/2024    8:15 AM 12/29/2023    4:32 PM 06/18/2023    6:36 PM  Depression screen PHQ 2/9  Decreased Interest 3 3 1   Down, Depressed, Hopeless 3 3 0  PHQ - 2 Score 6 6 1   Altered sleeping 1 1 1   Tired, decreased energy 3 3 0  Change in appetite 2 0 0  Feeling bad or failure about yourself  3 0 0  Trouble concentrating 3 1 0  Moving slowly or fidgety/restless 0 0 0  Suicidal thoughts 2 0 0  PHQ-9 Score 20 11  2    Difficult doing work/chores Somewhat difficult Very difficult Not difficult at all     Data saved with a previous flowsheet row definition   [place]-Suicide Severity Rating Scale 1) Have you wished you were dead or wished you could go to sleep and not wake up? - Yes  2) Have you had any actual thoughts of killing yourself? - No  Skip questions 3,4, 5  6) Have you ever done anything, started to do anything, or prepared to do anything to end your life? - No      06/02/2024    8:15 AM 05/15/2022    9:34 AM 04/03/2022   10:05 AM 01/16/2020    9:40 AM  GAD 7 :  Generalized Anxiety Score  Nervous, Anxious, on Edge 1 0 0 0  Control/stop worrying 3 0 0 0  Worry too much - different things 3 0 1 1  Trouble relaxing 3 1 0 0  Restless 0 0 0 0  Easily annoyed or irritable 1 1 1  0  Afraid - awful might happen 3 0 0 0  Total GAD 7 Score 14 2 2 1   Anxiety Difficulty Somewhat difficult Not difficult at all Not difficult at all Not difficult at all     Past Medical History:  Diagnosis Date   Anxiety    Asthma    Past Surgical History:  Procedure Laterality Date   POLYPECTOMY     TONSILLECTOMY     WISDOM TOOTH EXTRACTION     Social History   Socioeconomic History   Marital status: Single    Spouse name: Not on file   Number of children: Not on file   Years of education: Not on file   Highest education level: Associate degree: academic program  Occupational History   Occupation: Pending interview  Tobacco Use   Smoking status: Never   Smokeless tobacco: Never  Vaping Use   Vaping status: Never Used  Substance and  Sexual Activity   Alcohol use: Not Currently    Comment: Quit alcohol 05/2023   Drug use: No   Sexual activity: Never  Other Topics Concern   Not on file  Social History Narrative   Not on file   Social Drivers of Health   Tobacco Use: Low Risk (06/02/2024)   Patient History    Smoking Tobacco Use: Never    Smokeless Tobacco Use: Never    Passive Exposure: Not on file  Financial Resource Strain: Low Risk (05/29/2024)   Overall Financial Resource Strain (CARDIA)    Difficulty of Paying Living Expenses: Not very hard  Food Insecurity: No Food Insecurity (05/29/2024)   Epic    Worried About Radiation Protection Practitioner of Food in the Last Year: Never true    Ran Out of Food in the Last Year: Never true  Transportation Needs: No Transportation Needs (05/29/2024)   Epic    Lack of Transportation (Medical): No    Lack of Transportation (Non-Medical): No  Physical Activity: Insufficiently Active (05/29/2024)   Exercise Vital Sign    Days of  Exercise per Week: 2 days    Minutes of Exercise per Session: 30 min  Stress: Stress Concern Present (05/29/2024)   [employer] of Occupational Health - Occupational Stress Questionnaire    Feeling of Stress: To some extent  Social Connections: Socially Isolated (05/29/2024)   Social Connection and Isolation Panel    Frequency of Communication with Friends and Family: Once a week    Frequency of Social Gatherings with Friends and Family: Once a week    Attends Religious Services: Never    Database Administrator or Organizations: No    Attends Engineer, Structural: Not on file    Marital Status: Never married  Intimate Partner Violence: Not on file  Depression (PHQ2-9): High Risk (06/02/2024)   Depression (PHQ2-9)    PHQ-2 Score: 20  Alcohol Screen: Low Risk (06/14/2023)   Alcohol Screen    Last Alcohol Screening Score (AUDIT): 1  Housing: Low Risk (05/29/2024)   Epic    Unable to Pay for Housing in the Last Year: No    Number of Times Moved in the Last Year: 0    Homeless in the Last Year: No  Utilities: Not on file  Health Literacy: Not on file   Family History  Problem Relation Age of Onset   Depression Mother    Depression Father    Mental illness Maternal Grandfather    Current Outpatient Medications on File Prior to Visit  Medication Sig   albuterol  (PROVENTIL  HFA;VENTOLIN  HFA) 108 (90 Base) MCG/ACT inhaler Inhale 2 puffs into the lungs every 4 (four) hours as needed for wheezing or shortness of breath (cough).   cetirizine  (ZYRTEC ) 10 MG tablet Take 1 tablet (10 mg total) by mouth daily.   fluticasone (FLONASE) 50 MCG/ACT nasal spray Place 2 sprays into both nostrils daily.   omega-3 fish oil (MAXEPA) 1000 MG CAPS capsule Take 2,000 mg by mouth daily with breakfast.   No current facility-administered medications on file prior to visit.    Review of Systems  Constitutional:  Negative for activity change, appetite change, chills, diaphoresis, fatigue and fever.   HENT:  Negative for congestion and hearing loss.   Eyes:  Negative for visual disturbance.  Respiratory:  Negative for cough, chest tightness, shortness of breath and wheezing.   Cardiovascular:  Negative for chest pain, palpitations and leg swelling.  Gastrointestinal:  Negative for abdominal pain, constipation,  diarrhea, nausea and vomiting.  Genitourinary:  Negative for dysuria, frequency and hematuria.  Musculoskeletal:  Negative for arthralgias and neck pain.  Skin:  Negative for rash.  Neurological:  Negative for dizziness, weakness, light-headedness, numbness and headaches.  Hematological:  Negative for adenopathy.  Psychiatric/Behavioral:  Negative for behavioral problems, dysphoric mood and sleep disturbance.    Per HPI unless specifically indicated above     Objective:    BP 110/78 (BP Location: Right Arm, Patient Position: Sitting, Cuff Size: Normal)   Pulse 61   Ht 5' 10 (1.778 m)   Wt 219 lb 2 oz (99.4 kg)   SpO2 98%   BMI 31.44 kg/m   Wt Readings from Last 3 Encounters:  06/02/24 219 lb 2 oz (99.4 kg)  06/18/23 194 lb (88 kg)  05/15/22 224 lb (101.6 kg)    Physical Exam Vitals and nursing note reviewed.  Constitutional:      General: He is not in acute distress.    Appearance: He is well-developed. He is not diaphoretic.     Comments: Well-appearing, comfortable, cooperative  HENT:     Head: Normocephalic and atraumatic.  Eyes:     General:        Right eye: No discharge.        Left eye: No discharge.     Conjunctiva/sclera: Conjunctivae normal.     Pupils: Pupils are equal, round, and reactive to light.  Neck:     Thyroid: No thyromegaly.  Cardiovascular:     Rate and Rhythm: Normal rate and regular rhythm.     Pulses: Normal pulses.     Heart sounds: Normal heart sounds. No murmur heard. Pulmonary:     Effort: Pulmonary effort is normal. No respiratory distress.     Breath sounds: Normal breath sounds. No wheezing or rales.  Abdominal:      General: Bowel sounds are normal. There is no distension.     Palpations: Abdomen is soft. There is no mass.     Tenderness: There is no abdominal tenderness.  Musculoskeletal:        General: No tenderness. Normal range of motion.     Cervical back: Normal range of motion and neck supple.     Comments: Upper / Lower Extremities: - Normal muscle tone, strength bilateral upper extremities 5/5, lower extremities 5/5  Lymphadenopathy:     Cervical: No cervical adenopathy.  Skin:    General: Skin is warm and dry.     Findings: No erythema or rash.  Neurological:     Mental Status: He is alert and oriented to person, place, and time.     Comments: Distal sensation intact to light touch all extremities  Psychiatric:        Mood and Affect: Mood normal.        Behavior: Behavior normal.        Thought Content: Thought content normal.     Comments: Well groomed, good eye contact, normal speech and thoughts         Assessment & Plan:   Problem List Items Addressed This Visit     Alcohol dependence in remission (HCC)   Anxiety   Relevant Medications   escitalopram  (LEXAPRO ) 10 MG tablet   Major depressive disorder, recurrent, moderate (HCC)   Relevant Medications   escitalopram  (LEXAPRO ) 10 MG tablet   Mixed hyperlipidemia   Relevant Orders   TSH   Lipid panel   CBC with Differential/Platelet   Comprehensive metabolic panel with GFR  Psychophysiological insomnia   Other Visit Diagnoses       Annual physical exam    -  Primary   Relevant Orders   TSH   Lipid panel   Hemoglobin A1c   CBC with Differential/Platelet   Comprehensive metabolic panel with GFR     Abnormal glucose       Relevant Orders   Hemoglobin A1c        Updated Health Maintenance information Reviewed recent lab results with patient Encouraged improvement to lifestyle with diet and exercise Goal of weight loss  Major Depression recurrent, moderate Chronic problem. Previously with Psychiatry /  Therapist, no longer Has sertraline  from previous interested in new rx Taper off sertraline  to Escitalopram  10mg  Adjust dose accordingly  Hyperlipidemia Previous elevated Pending labs Encourage diet lifestyle   Alcohol Dependence, in remission Self-reported abstinence from alcohol >1 year Off Naltrexone  Previously w/ rehab and AA, no longer required     Orders Placed This Encounter  Procedures   TSH   Lipid panel    Has the patient fasted?:   Yes   Hemoglobin A1c   CBC with Differential/Platelet   Comprehensive metabolic panel with GFR    Has the patient fasted?:   Yes    Meds ordered this encounter  Medications   escitalopram  (LEXAPRO ) 10 MG tablet    Sig: Take 1 tablet (10 mg total) by mouth daily.    Dispense:  90 tablet    Refill:  1    Discontinue Sertraline      Follow up plan: Return in about 3 months (around 08/31/2024) for 3 month follow-up Mood / med adjust.  Marsa Officer, DO [facility] 06/02/2024, 8:25 AM  "

## 2024-06-02 NOTE — Patient Instructions (Addendum)
 Thank you for coming to the office today.  Taper Sertraline  current 25mg  tabs x 4 = 100mg , reduce to half dose 50mg  for 1 week then reduce by half again for a few days to a week, approx 2 week taper then switch over to Lexapro  (escitalopram ) 10mg  daily  If after 2-4 weeks feeling no benefit on the lexapro , you may double dose from 10 to 20mg .  Contact me for dose adjustment if needed.  Labs today   These offices have both PSYCHIATRY doctors and Haematologist Available) [facility] [address] [place], [place] [address] Phone: [phone]  [facility] (Virtual Available) [address], [place], [place] [address] Phone: [phone]  [facility] Address: [address] [place], [place] [address] [email or web address] Phone:  [phone]  [facility] [place] [facility]) Address: [address], [place], [place] [address] Hours: 8:30AM-5PM Phone: [phone]  [facility] (Adult, Peds, Geriatric, Counseling) [address] [place], [place] [address] Phone: [phone] Fax: [phone]  [facility] [address] [place], [place] [address] Phone: [phone]  [facility] (All ages) [address] [place], [id number] Phone: [phone] (Option 1) [email or web address]  [facility] (including Age 91+) [address] [place], [place] [address], [place]  [place], [place] Phone: [phone] FAX: [phone]  ----------------------------------------------------------------- THERAPIST ONLY  (No Psychiatry)  [facility] [address] [place], [place] [address] [place]   [phone]  [facility],  [facility] [address] [place], [place] [address] [phone]  [facility]   Address: [address] [place], [place] [address] Hours: Open today  9AM-7PM Phone: [phone]  [facility] [address], [facility]  - [facility] Address: [address] [place], [place] [address] Phone: [phone]  [facility] [place], [place] [address] Phone: [phone]  --------------  Alcohol / Substance  [facility] [phone] [facility]  [phone] [facility] [phone]   Please schedule a Follow-up Appointment to: No follow-ups on file.  If you have any other questions or concerns, please feel free to call the office or send a message through MyChart. You may also schedule an earlier appointment if necessary.  Additionally, you may be receiving a survey about your experience at our office within a few days to 1 week by e-mail or mail. We value your feedback.  Marsa Officer, DO [facility], [place]

## 2024-06-03 LAB — CBC WITH DIFFERENTIAL/PLATELET
Absolute Lymphocytes: 1402 {cells}/uL (ref 850–3900)
Absolute Monocytes: 320 {cells}/uL (ref 200–950)
Basophils Absolute: 29 {cells}/uL (ref 0–200)
Basophils Relative: 0.7 %
Eosinophils Absolute: 62 {cells}/uL (ref 15–500)
Eosinophils Relative: 1.5 %
HCT: 46.9 % (ref 39.4–51.1)
Hemoglobin: 15.7 g/dL (ref 13.2–17.1)
MCH: 31.2 pg (ref 27.0–33.0)
MCHC: 33.5 g/dL (ref 31.6–35.4)
MCV: 93.1 fL (ref 81.4–101.7)
MPV: 10.2 fL (ref 7.5–12.5)
Monocytes Relative: 7.8 %
Neutro Abs: 2288 {cells}/uL (ref 1500–7800)
Neutrophils Relative %: 55.8 %
Platelets: 224 Thousand/uL (ref 140–400)
RBC: 5.04 Million/uL (ref 4.20–5.80)
RDW: 12.9 % (ref 11.0–15.0)
Total Lymphocyte: 34.2 %
WBC: 4.1 Thousand/uL (ref 3.8–10.8)

## 2024-06-03 LAB — COMPREHENSIVE METABOLIC PANEL WITH GFR
AG Ratio: 2.7 (calc) — ABNORMAL HIGH (ref 1.0–2.5)
ALT: 16 U/L (ref 9–46)
AST: 14 U/L (ref 10–40)
Albumin: 5.3 g/dL — ABNORMAL HIGH (ref 3.6–5.1)
Alkaline phosphatase (APISO): 42 U/L (ref 36–130)
BUN: 21 mg/dL (ref 7–25)
CO2: 30 mmol/L (ref 20–32)
Calcium: 9.6 mg/dL (ref 8.6–10.3)
Chloride: 104 mmol/L (ref 98–110)
Creat: 0.79 mg/dL (ref 0.60–1.24)
Globulin: 2 g/dL (ref 1.9–3.7)
Glucose, Bld: 98 mg/dL (ref 65–99)
Potassium: 4.6 mmol/L (ref 3.5–5.3)
Sodium: 141 mmol/L (ref 135–146)
Total Bilirubin: 1.1 mg/dL (ref 0.2–1.2)
Total Protein: 7.3 g/dL (ref 6.1–8.1)
eGFR: 123 mL/min/1.73m2

## 2024-06-03 LAB — LIPID PANEL
Cholesterol: 223 mg/dL — ABNORMAL HIGH
HDL: 47 mg/dL
LDL Cholesterol (Calc): 151 mg/dL — ABNORMAL HIGH
Non-HDL Cholesterol (Calc): 176 mg/dL — ABNORMAL HIGH
Total CHOL/HDL Ratio: 4.7 (calc)
Triglycerides: 128 mg/dL

## 2024-06-03 LAB — HEMOGLOBIN A1C
Hgb A1c MFr Bld: 4.8 %
Mean Plasma Glucose: 91 mg/dL
eAG (mmol/L): 5 mmol/L

## 2024-06-03 LAB — TSH: TSH: 1.45 m[IU]/L (ref 0.40–4.50)

## 2024-06-12 ENCOUNTER — Ambulatory Visit: Payer: Self-pay | Admitting: Family Medicine

## 2024-08-25 ENCOUNTER — Ambulatory Visit: Admitting: Family Medicine
